# Patient Record
Sex: Female | Born: 1950 | Race: White | Hispanic: No | Smoking: Former smoker
Health system: Southern US, Community
[De-identification: ages and names within clinical notes are randomized; demographics above are authoritative.]

## PROBLEM LIST (undated history)

## (undated) DIAGNOSIS — M549 Dorsalgia, unspecified: Secondary | ICD-10-CM

## (undated) DIAGNOSIS — I10 Essential (primary) hypertension: Secondary | ICD-10-CM

## (undated) DIAGNOSIS — Z9889 Other specified postprocedural states: Secondary | ICD-10-CM

## (undated) DIAGNOSIS — R0683 Snoring: Secondary | ICD-10-CM

## (undated) DIAGNOSIS — E119 Type 2 diabetes mellitus without complications: Secondary | ICD-10-CM

## (undated) DIAGNOSIS — L03039 Cellulitis of unspecified toe: Secondary | ICD-10-CM

## (undated) DIAGNOSIS — K5900 Constipation, unspecified: Secondary | ICD-10-CM

## (undated) DIAGNOSIS — Z9289 Personal history of other medical treatment: Secondary | ICD-10-CM

## (undated) DIAGNOSIS — I639 Cerebral infarction, unspecified: Secondary | ICD-10-CM

## (undated) DIAGNOSIS — K589 Irritable bowel syndrome without diarrhea: Secondary | ICD-10-CM

## (undated) DIAGNOSIS — I679 Cerebrovascular disease, unspecified: Secondary | ICD-10-CM

## (undated) DIAGNOSIS — E785 Hyperlipidemia, unspecified: Secondary | ICD-10-CM

## (undated) DIAGNOSIS — M797 Fibromyalgia: Secondary | ICD-10-CM

## (undated) DIAGNOSIS — M545 Low back pain, unspecified: Secondary | ICD-10-CM

## (undated) DIAGNOSIS — J45909 Unspecified asthma, uncomplicated: Secondary | ICD-10-CM

## (undated) DIAGNOSIS — E669 Obesity, unspecified: Secondary | ICD-10-CM

## (undated) HISTORY — DX: Low back pain, unspecified: M54.50

## (undated) HISTORY — DX: Snoring: R06.83

## (undated) HISTORY — DX: Cellulitis of unspecified toe: L03.039

## (undated) HISTORY — PX: GALLBLADDER SURGERY: SHX652

## (undated) HISTORY — DX: Hyperlipidemia, unspecified: E78.5

## (undated) HISTORY — PX: HERNIA REPAIR: SHX51

## (undated) HISTORY — DX: Fibromyalgia: M79.7

## (undated) HISTORY — DX: Low back pain: M54.5

## (undated) HISTORY — DX: Personal history of other medical treatment: Z92.89

## (undated) HISTORY — PX: TONSILLECTOMY AND ADENOIDECTOMY: SHX28

## (undated) HISTORY — DX: Cerebrovascular disease, unspecified: I67.9

## (undated) HISTORY — DX: Unspecified asthma, uncomplicated: J45.909

## (undated) HISTORY — PX: CHOLECYSTECTOMY: SHX55

## (undated) HISTORY — DX: Constipation, unspecified: K59.00

## (undated) HISTORY — DX: Dorsalgia, unspecified: M54.9

## (undated) HISTORY — PX: CATARACT EXTRACTION: SUR2

## (undated) HISTORY — DX: Other specified postprocedural states: Z98.890

---
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
## 2000-06-30 ENCOUNTER — Observation Stay (HOSPITAL_COMMUNITY): Admission: RE | Admit: 2000-06-30 | Discharge: 2000-07-01 | Payer: Self-pay | Admitting: General Surgery

## 2000-06-30 ENCOUNTER — Encounter: Payer: Self-pay | Admitting: General Surgery

## 2001-04-26 DIAGNOSIS — I639 Cerebral infarction, unspecified: Secondary | ICD-10-CM

## 2001-04-26 HISTORY — PX: COLONOSCOPY: SHX174

## 2001-04-26 HISTORY — DX: Cerebral infarction, unspecified: I63.9

## 2002-04-10 ENCOUNTER — Ambulatory Visit: Admission: RE | Admit: 2002-04-10 | Discharge: 2002-04-10 | Payer: Self-pay | Admitting: Internal Medicine

## 2002-06-13 ENCOUNTER — Inpatient Hospital Stay (HOSPITAL_COMMUNITY): Admission: RE | Admit: 2002-06-13 | Discharge: 2002-06-17 | Payer: Self-pay | Admitting: General Surgery

## 2008-07-29 ENCOUNTER — Inpatient Hospital Stay (HOSPITAL_COMMUNITY): Admission: AD | Admit: 2008-07-29 | Discharge: 2008-07-31 | Payer: Self-pay | Admitting: Pediatrics

## 2008-07-30 ENCOUNTER — Encounter (INDEPENDENT_AMBULATORY_CARE_PROVIDER_SITE_OTHER): Payer: Self-pay | Admitting: Pediatrics

## 2009-08-10 ENCOUNTER — Ambulatory Visit: Payer: Self-pay | Admitting: Cardiology

## 2010-08-05 LAB — CBC
HCT: 36.8 % (ref 36.0–46.0)
Hemoglobin: 12.8 g/dL (ref 12.0–15.0)
MCHC: 34.7 g/dL (ref 30.0–36.0)
MCV: 90.1 fL (ref 78.0–100.0)
Platelets: 188 10*3/uL (ref 150–400)
RBC: 4.08 MIL/uL (ref 3.87–5.11)
RDW: 14.3 % (ref 11.5–15.5)
WBC: 6.9 10*3/uL (ref 4.0–10.5)

## 2010-08-05 LAB — COMPREHENSIVE METABOLIC PANEL WITH GFR
ALT: 20 U/L (ref 0–35)
AST: 17 U/L (ref 0–37)
Albumin: 3.5 g/dL (ref 3.5–5.2)
Alkaline Phosphatase: 73 U/L (ref 39–117)
BUN: 12 mg/dL (ref 6–23)
CO2: 26 meq/L (ref 19–32)
Calcium: 8.9 mg/dL (ref 8.4–10.5)
Chloride: 102 meq/L (ref 96–112)
Creatinine, Ser: 0.64 mg/dL (ref 0.4–1.2)
GFR calc Af Amer: >60 mL/min (ref >60)
GFR calc non Af Amer: >60 mL/min (ref >60)
Glucose, Bld: 128 mg/dL — ABNORMAL HIGH (ref 70–99)
Potassium: 3.6 meq/L (ref 3.5–5.1)
Sodium: 136 meq/L (ref 135–145)
Total Bilirubin: 0.8 mg/dL (ref 0.3–1.2)
Total Protein: 6.2 g/dL (ref 6.0–8.3)

## 2010-08-05 LAB — PROTIME-INR: Prothrombin Time: 14 seconds (ref 11.6–15.2)

## 2010-08-05 LAB — LIPID PANEL
Cholesterol: 162 mg/dL (ref 0–200)
HDL: 31 mg/dL — ABNORMAL LOW (ref >39)
LDL Cholesterol: 109 mg/dL — ABNORMAL HIGH (ref 0–99)
Triglycerides: 112 mg/dL (ref <150)

## 2010-08-05 LAB — GLUCOSE, CAPILLARY: Glucose-Capillary: 118 mg/dL — ABNORMAL HIGH (ref 70–99)

## 2010-08-05 LAB — HEMOGLOBIN A1C
Hgb A1c MFr Bld: 6.5 % — ABNORMAL HIGH (ref 4.6–6.1)
Mean Plasma Glucose: 140 mg/dL

## 2010-09-08 NOTE — H&P (Signed)
Andrea Rios, Andrea Rios                  ACCOUNT NO.:  [id number]   MEDICAL RECORD NO.:  [id number]          PATIENT TYPE:  INP   LOCATION:  3113                         FACILITY:  [facility]   PHYSICIAN:  Deanna Artis. Hickling, M.D.DATE OF BIRTH:  12-09-1950   DATE OF ADMISSION:  07/29/2008  DATE OF DISCHARGE:                              HISTORY & PHYSICAL   CHIEF COMPLAINT:  Right-sided numbness and weakness.   HISTORY OF PRESENT CONDITION:  The patient was last known normal around  1330 hours.  She was having lunch.  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  She developed paresthesias of the right arm, which  did not go away and intensified over time.  Her symptoms spread to her  face between 1500 and 1510.  She wanted to go home, but coworkers  persuaded her to go to the emergency room where she checked in at 1528.  She was sent to CT scan and had it completed at 1551.  Call was placed  to me at 1615.  At that time, she was 2 hours and 45 minutes after last  being known normal.  Her NIH stroke scale was 5.  She had 1 point each  for ataxia with her upper extremity limbs, 1 point each for weakness in  her right arm and leg and 1 point for sensation.   Because of the combination of weakness, ataxia, and numbness that seemed  to be spreading, I recommended treatment with intravenous t-PA.  The  patient weighs 258 pounds and was given 90 mg of Activase 9 mg bolus at  1630 followed by 81 mg over the next every ensuing hour.  The patient  was transferred to [facility] and over time, the weakness  disappeared, the numbness subsided.   PAST MEDICAL HISTORY:  Remarkable for morbid obesity, osteoarthritis of  her knees, and symptomatic hypertension.   PAST SURGICAL HISTORY:  Carpal tunnel x2, abdominal herniorrhaphy repair  x3, tonsillectomy, and cholecystectomy.   RISK FACTORS FOR STROKE:  Morbid obesity and hypertension.   CURRENT MEDICATIONS:  Amlodipine/benazepril 5/20  one daily.   DRUG ALLERGIES:  SULFA causes hives, CORTICOSTEROIDS cause altered  mental status and swelling.   FAMILY HISTORY:  Positive for stroke in maternal grandmother who died in  her 88s of a stroke.  She had severe heart disease and presumed atrial  fibrillation.  Both parents died of congestive heart failure.   SOCIAL HISTORY:  The patient is widowed.  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  She is a Engineer, agricultural.  She is Jehovah's  Witness.  She has not used tobacco, alcohol, or drugs.   REVIEW OF SYSTEMS:  Her 12-system review is recorded on the chart and is  remarkable for shortness of breath with exertion, pain in her knees and  her neck from osteoarthritis, and she is postmenopausal.  Remainder is  negative except as noted above in history of present illness.   PHYSICAL EXAMINATION:  GENERAL:  Pleasant woman who is anxious and  at  times tearful.  VITAL SIGNS:  Temperature 98.9, blood pressure 132/56, resting pulse 70,  respirations 20, oxygen saturation 94%, height 5 feet 4 inches, weight  250 pounds.  HEAD, EYES, EARS, NOSE, AND THROAT:  No infections.  NECK:  No bruits.  Supple neck.  LUNGS:  Clear.  HEART:  No murmurs.  Pulses normal.  ABDOMEN:  Protuberant.  Bowel sounds normal.  No hepatosplenomegaly.  SKIN:  No lesions.  NEUROLOGIC:  Awake, alert without dysphasia or dyspraxia.  Cranial  nerves, round reactive pupils.  Visual fields full to double  simultaneous stimuli.  Extraocular movements full and conjugate.  Symmetric facial strength.  Midline tongue and uvula.  The patient had  decreased sensation to pinprick on the right side that did not respect  to midline.  Motor examination, the patient showed normal strength  except her hip flexors, which were 4/5 bilaterally.  She had pain in her  knees, which limited some of the testing that I could do with her legs,  but basically she had normal strength everywhere else.  She had good   fine motor movements and good finger apposition.  No pronator drift.  Sensation showed hypesthesia on the right side that did not respect the  midline, this involves arm, face, trunk, and legs.  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  She extinguished to  double simultaneous stimuli for tactile sensation, she did not for  visual.  She had no signs of dysphasia.  Reflexes were absent.  She had  bilateral flexor plantar responses.  I watched her get up to walk and  she has got a somewhat antalgic gait, but does not show hemiparetic  gait.   IMPRESSION:  1. Left brain subcortical non-hemorrhagic infarction.  I suspect      posterior limb of the internal capsule based on her numbness.  2. Hypertension, in fair control.  3. Morbid obesity.  The patient states that she lost 35 pounds in the      past year and gained back 10 of it.   PLAN:  The patient will be admitted to the hospital for observation and  treatment.  There is very little that we can do if she progresses  overnight.  We will perform an MRI scan of the brain in the morning.  We  will also obtain noninvasive carotid Dopplers, 2-D echocardiogram, and  transcranial Doppler, and obtain hemoglobin A1c and fasting lipid panel.  The latter will have to be obtained on November 30, 2008, because of the  use of t-PA.  She will receive aspirin at 1630 hours on July 30, 2008.  She received aspirin 15 minutes before she received t-PA, I think  possibly before they realized that t-PA might be given.  The NIH stroke  scale that I obtained was 4.  She had drift of her legs, both legs  slightly had hemihypesthesia on the right side and also extinguish to  double simultaneous stimuli on the right.  The rest was normal.   Her modified Rankin was 0 prior to admission.      Deanna Artis. Sharene Skeans, M.D.  Electronically Signed     [facility]/MEDQ  D:   07/29/2008  T:  07/30/2008  Job:  [id number]   cc:   Kirstie Peri, MD

## 2010-09-08 NOTE — Discharge Summary (Signed)
Andrea Rios, Andrea Rios                  ACCOUNT NO.:  [id number]   MEDICAL RECORD NO.:  [id number]          PATIENT TYPE:  INP   LOCATION:  3113                         FACILITY:  [facility]   PHYSICIAN:  Melvyn Novas, M.D.  DATE OF BIRTH:  Jan 15, 1951   DATE OF ADMISSION:  07/29/2008  DATE OF DISCHARGE:  07/31/2008                               DISCHARGE SUMMARY   DIAGNOSES AT TIME OF DISCHARGE:  1. Left brain transient ischemic attack, symptoms resolved.  2. Obesity.  3. Hypertension.  4. Question history of obstructive sleep apnea.  5. Nocturia.  6. Insomnia.  7. Right hand weakness, likely carpal tunnel.  8. Mild dyslipidemia.   Medicines at the time of discharge.  1. Zocor 20 mg a day.  2. Aspirin 81 mg a day.  3. Amlodipine/benazepril 5/20 mg a day.   STUDIES PERFORMED:  1. CT of brain completed at [facility] showed no acute abnormalities.  2. Now, MRI of the brain at [facility] shows no acute abnormality.  3. MRA of the head is a negative.  4. Chest x-ray shows no active lung disease.  5. A 2-D echocardiogram shows EF of 50-60% with no obvious source of      embolus.  6. Carotid Doppler shows no ICA stenosis.  7. EKG not present in chart, though telemetry shows sinus rhythm.   LABORATORY STUDIES:  Hemoglobin A1c 6.5.  Cholesterol was 62,  triglycerides 112, HDL 31, LDL 109.  Chemistry with glucose 128,  otherwise normal.  Coagulation studies normal.  CBC normal.   HISTORY OF PRESENT ILLNESS:  Andrea Rios is a 60 year old right-handed  white female who was last known normal at 1:30 p.m. the day of  admission.  She was having lunch.  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  At 1:30, she developed paresthesias of the right  arm which did not go away and intensified overtime.  Her symptoms spread  to her face around 3 o'clock.  She wanted to go home, but coworkers  persuaded her to go to the emergency room.  CT of the head done at  [facility] was normal.  Her NIH  stroke scale was 5.  [facility] ED  physician, Dr. Sharene Skeans, neurologist on-call, who ordered intravenous IV  t-PA and transferred to [facility].  She was transferred to [facility]  and admitted to the neuro ICU post full-dose IV t-PA.   HOSPITAL COURSE:  MRI of the brain was unrevealing for acute stroke.  Symptoms were definitely that of stroke and some very mild right hand  weakness remains at time of discharge.  She was evaluated by PT and OT  and felt to have no therapy needs.  She has vascular risk factors of  hypertension, obesity, dyslipidemia, and slightly elevated hemoglobin  A1c.  The patient was placed on Zocor to lower her LDL as well as  aspirin for secondary stroke prevention.  She has been advised to return  to work on Monday and follow up with her primary care physician for  ongoing risk factor control including weight  loss.  The patient also needs evaluation of insomnia.  She apparently has had a  speech study in the past.  She has asked to follow up with Dr. Vickey Huger.   CONDITION ON DISCHARGE:  The patient with mild right grip weakness,  otherwise no abnormal neurologic findings.  Heart rate regular.  Breath  sounds are clear.   DISCHARGE PLAN:  1. Discharge home with family.  2. Follow up primary care physician within 1 month for ongoing risk      factor control.  3. Follow up with DR. Sethis /    Dr. Porfirio Mylar Dohmeier, consider sleep evaluation Verne Spurr study followup.  1. May return to work on Monday.  2. New statin for dyslipidemia.      Annie Main, N.P.      Melvyn Novas, M.D.  Electronically Signed    SB/MEDQ  D:  07/31/2008  T:  08/01/2008  Job:  [id number]   cc:   Kirstie Peri, MD

## 2010-09-11 NOTE — Discharge Summary (Signed)
   NAMELANNETTE, Andrea Rios                            ACCOUNT NO.:  [id number]   MEDICAL RECORD NO.:  [id number]                   PATIENT TYPE:  INP   LOCATION:  0484                                 [facility]ACILITY:  [facility]   PHYSICIAN:  Adolph Pollack, M.D.            DATE OF BIRTH:  11-26-1950   DATE OF ADMISSION:  06/12/2002  DATE OF DISCHARGE:  06/17/2002                                 DISCHARGE SUMMARY   PRINCIPAL DISCHARGE DIAGNOSIS:  Recurrent incarcerated ventral hernia.   SECONDARY DIAGNOSES:  1. Gastroesophageal reflux.  2. Hypertension.  3. Sleep apnea.  4. Ileus.   PROCEDURE:  Laparoscopic repair of incarcerated ventral incisional hernia  with Gore-Tex dual mesh on 06/12/02.   HISTORY OF PRESENT ILLNESS:  The patient is a 60 year old female who has had  a known recurrent ventral incisional hernia.  She has had two previous  repairs with mesh and has recurrence of her symptoms symptomatic.  She is  admitted for elective repair.   HOSPITAL COURSE:  She had undergone the above procedure.  She was found to  have a giant recurrent hernia, and it took a very large piece of Gore-Tex  dual mesh.  Postoperatively, pain control was an issue as well as nausea and  mild ileus.  However, over the ensuing days this improved, then tolerating a  diet, passing flatus, and had a small bowel movement, and was felt to be  ready for discharge.   DISPOSITION:  Discharged to home on 06/17/02, in satisfactory condition.   DISCHARGE MEDICATIONS:  1. She is told to continue home medications.  2. Tylox for pain.  3. She is told to take some Mylicon for gas as needed.   ACTIVITY:  She is given strict activity restrictions.   FOLLOWUP:  She will follow up in the office in about two to three weeks.                                                Adolph Pollack, M.D.    Andrea Rios  D:  06/25/2002  T:  06/25/2002  Job:  [id number]

## 2010-09-11 NOTE — H&P (Signed)
Andrea Rios, Andrea Rios                            ACCOUNT NO.:  [id number]   MEDICAL RECORD NO.:  [id number]                   PATIENT TYPE:  AMB   LOCATION:  DAY                                  FACILITY:  [facility]   PHYSICIAN:  Adolph Pollack, M.D.            DATE OF BIRTH:  08-04-1950   DATE OF ADMISSION:  06/12/2002  DATE OF DISCHARGE:                                HISTORY & PHYSICAL   REASON FOR ADMISSION:  Elective ventral hernia repair.   HISTORY OF PRESENT ILLNESS:  The patient is a 60 year old female who has had  two abdominal wall hernia repairs.  Unfortunately, she has had a recurrence.  Both of these defects have been fixed by open approach.  Now she is having  pain from this, but no obstructive type symptoms.  She is now admitted for  elective laparoscopic ventral hernia repair with mesh.   PAST MEDICAL HISTORY:  1. Abdominal hernias.  2. Hypertension.  3. Gastroesophageal reflux disease.  4. Arthritis.  5. Rosacea of cheeks.  6. Mild obstructive sleep apnea.  7. Non-cardiac chest pain.  8. Sinus related headaches.   PAST SURGICAL HISTORY:  1. Abdominal hernia repair x2.  2. Laparoscopic cholecystectomy with intraoperative cholangiogram.  3. Tonsillectomy and adenoidectomy.   ALLERGIES:  1. SULFA MEDICATIONS.  2. STEROIDS.   MEDICATIONS:  1. Ibuprofen p.r.n.  2. Aciphex.  3. Tums p.r.n.   SOCIAL HISTORY:  She is widowed.  She is a Scientist, product/process development.  No tobacco  use.  She occasionally has an alcoholic beverage.   FAMILY HISTORY:  Father died from congestive heart failure.  Mother died  also has problems with congestive heart failure.  She has a brother with  coronary artery disease and a sister with heart disease and hypertension.   REVIEW OF SYMPTOMS:  Please see attached health history/assessment.   PHYSICAL EXAMINATION:  GENERAL:  An obese female in no acute distress.  VITAL SIGNS:  She weighs 262 pounds.  Temperature is 97.6, blood pressure is  150/80, pulse is 72.  SKIN:  Warm and dry, no jaundice.  HEENT:  Extraocular movements intact.  CARDIOVASCULAR:  Heart demonstrates regular rate and rhythm, no murmur  heard.  RESPIRATORY:  Breath sounds equal and clear.  Respirations unlabored.  ABDOMEN:  Soft.  There is a bulge to the right of the midline incision in  the periumbilical region that is tender, but is reducible.  No other masses  noted.  No organomegaly noted.  EXTREMITIES:  Trace pretibial edema.   IMPRESSION:  Recurrent ventral incisional hernia.   PLAN:  Laparoscopic repair with mesh.  The procedure and the risks were  discussed with her preoperatively.  She understands and agrees to proceed.  Adolph Pollack, M.D.    Andrea Rios  D:  06/12/2002  T:  06/12/2002  Job:  [id number]

## 2010-09-11 NOTE — Op Note (Signed)
[facility]  Patient:    Andrea Rios, Andrea Rios                         MRN: [id number] Proc. Date: 06/30/00 Adm. Date:  [id number] Attending:  Arlis Porta CC:         Dr. Franky Macho             Dr. Letta Median                           Operative Report  PREOPERATIVE DIAGNOSES:  Biliary dyskinesia and ventral hernia.  POSTOPERATIVE DIAGNOSES:  Biliary dyskinesia and ventral hernia.  PROCEDURE:  Laparoscopic cholecystectomy.  SURGEON:  Dr. Abbey Chatters.  FIRST ASSISTANT:  Dr. Chevis Pretty.  SECOND ASSISTANT:  Dr. Peter Minium.  ANESTHESIA:  General.  INDICATIONS FOR PROCEDURE:  This is a 60 year old female whose had two abdominal wall hernia repairs in the past. She has had fairly classic biliary colic type pain. An ultrasound was normal. She did undergo a nuclear medicine hepatobiliary scan which demonstrated gallbladder ejection fraction only 17%. Although she has an obvious recurrent hernia which is noted on CT scan, her symptoms appear to be more of a biliary type. She saw me wondering if a laparoscopic cholecystectomy and laparoscopic ventral hernia repair could be done simultaneously. I told her I would not do that because of increased risk of infection. I told her she could either go back to Dr. Lovell Sheehan and have her laparoscopic cholecystectomy done and then come back to me for the hernia repair or I would be glad to do the laparoscopic cholecystectomy. She decided to have it done here in [place]. The procedure and risks have been explained to her.  TECHNIQUE:  She is placed supine on the operating table and a general anesthetic was administered. Her abdomen was sterilely prepped and draped. A previous subumbilical incision was reincised sharply. The fascia was identified and incised. The peritoneal cavity was then entered bluntly. I then placed a pursestring suture of #0 Vicryl on the fascial edges. The Hasson trocar was introduced to  the peritoneal cavity and pneumoperitoneum created by insufflation of CO2 gas. I then introduced the laparoscope into the peritoneal cavity. I was able to see the right lower quadrant and then when I was looking up to the right upper quadrant, there were adhesions between the omentum and the anterior abdominal wall. I could see the hernia and there was no incarcerated small bowel in it. I also saw the previously placed mesh from the hernia repair.  Next under direct vision, a 5 mm trocar was placed in the right lateral mid abdomen. Using sharp dissection only, adhesions were divided and the right upper quadrant area exposed. Subsequently an 11 mm trocar was placed through and epigastric incision and another 5 mm trocar was placed in the right upper quadrant. We were able to grasp the fundus of the gallbladder and also grasp some infundibulum. Using careful blunt dissection, I identified the gallbladder cystic duct junction and skeletonized the cystic duct. I mobilized the infundibulum. I also identified the anterior branch of the cystic artery and skeletonized this. The cystic duct was small. I clipped it three times proximally, once distally and divided it. I decided preoperatively not to do a cholangiogram because her liver function tests were normal.  Next the anterior branch of the cystic artery was clipped and divided and the posterior branch of the  cystic artery was clipped and divided. The gallbladder was dissected free and removed from the liver bed using the cautery. Two holes were placed in the gallbladder through which bile and some small  sludge type material was drained. Once I had removed the gallbladder from the liver bed, I placed it in an endopouch bag. I then copiously irrigated the liver bed, bleeding points were controlled with the cautery. I then irrigated the perihepatic area until the fluid was clear. I once again inspected the gallbladder fossa and noted no bile  leak or no bleeding. I then removed the gallbladder from the abdominal cavity. I identified the subumbilical fascial defect which was created and closed under laparoscopic guidance by tightening down and tying the pursestring suture. Next the patient was placed in the flat position and the rest of the irrigation fluid was evacuated (as much as possible). The two 5 mm trocars were then removed.  Skin incisions were then closed with 4-0 monocryl subcuticular stitches except for an inferior 5 mm incision which was bleeding some from the skin edges and this required simple exterior 4-0 monocryl sutures. Steri-Strips and sterile dressings were used. She tolerated the procedure well without any apparent complications and then she was taken to the recovery room in satisfactory condition. DD:  06/30/00 TD:  07/01/00 Job: [id number]

## 2010-09-11 NOTE — Op Note (Signed)
NAMEMOYA, DUAN                            ACCOUNT NO.:  [id number]   MEDICAL RECORD NO.:  [id number]                   PATIENT TYPE:  AMB   LOCATION:  DAY                                  FACILITY:  [facility]   PHYSICIAN:  Adolph Pollack, M.D.            DATE OF BIRTH:  1951/03/03   DATE OF PROCEDURE:  06/12/2002  DATE OF DISCHARGE:                                 OPERATIVE REPORT   PREOPERATIVE DIAGNOSIS:  Recurrent ventral hernia.   POSTOPERATIVE DIAGNOSIS:  Recurrent incarcerated ventral hernia.   PROCEDURE:  Laparoscopic repair of incarcerated ventral hernia with Gore-Tex  dual mesh.   SURGEON:  Adolph Pollack, M.D.   ASSISTANT:  Gita Kudo, M.D.   ANESTHESIA:  General.   INDICATIONS FOR PROCEDURE:  The patient is a 60 year old female who has had  two abdominal wall hernia repairs before.  She is having more pain from this  recurrent hernia.  Both the other approaches have been from an anterior  approach.  She now presents for elective hernia repair.  The procedure and  the risks were discussed with her.   TECHNIQUE:  She was seen in the holding area, brought to the operating room  and placed supine on the operating table and general anesthetic was  administered.  The abdomen was widely sterilely prepped and draped and Foley  catheter had been placed in the bladder.  In the left lateral abdominal wall  an incision was made and carried down through the subcutaneous tissue.  Different layers of fascia were incised and the peritoneum was incised  sharply and under direct vision the peritoneal cavity entered.  Two stay  sutures of 0 Vicryl were placed on the superior and inferior aspect of the  fascia and a Hasson trocar was introduced into the peritoneal cavity.  A  pneumoperitoneum was created upon insufflation with CO2 gas.   Next, the laparoscope was introduced.  The hernia was identified.  There  were actually areas of hernia with exposed mesh.  There  appeared to be small  bowel and colon densely adherent to the mesh.  I placed a 5-mm trocar in the  left upper quadrant.  Again, by using gentle sharp and blunt dissection with  various electrocautery away from the bowel to reduce omental contents from  the anterior abdominal wall and the hernia.  I noticed that there were three  separate defects along the upper midline with the inferior defect and the  superior defect containing bowel.  The inferior defect contained small bowel  and the superior defect contained large bowel.  I did as much dissection as  I could around the bowel.  I then was able to develop a plane between the  colon and the mesh at the superior defect.  Using sharp dissection I was  able to reduce the colon back into the peritoneal cavity without injuring  the colon.  I subsequently went to the middle defect and removed reduced  omentum from that.  I then concentrated on the largest defect which was  inferior with respect to the upper midline incision and previous repair.  I  was able to gently retract the small bowel toward the midline using sharp  dissection and divide firm peritoneal attachments to the small bowel without  injuring the small bowel.  I examined both the colon and the small bowel  multiple times after this and noted no injury.  I then completely reduced  omentum from the anterior abdominal wall exposing the defect which was  basically the entire epigastric incision with bridges in between the three  aforementioned defects.   I placed spinal needles around the periphery of the defect and kneaded the  piece of mesh that was approximately 26 cm x 34 cm.  __________  a piece of  Gore-Tex corduroy dual mesh with antimicrobial coating was brought onto the  field.  I then placed stay sutures in eight different areas of the mesh.  First the four quadrants, then I bisected each of the four quadrants placing  a stay suture there.  These were of 0 Novofil.  I then  rolled up the mesh  and passed it through the left mid abdominal incision.  I added another 5-mm  trocar in the right upper quadrant and put a 10-mm trocar in the right mid  abdomen.  I subsequently added another 5-mm trocar in the right lower  quadrant.   Once the mesh was in the abdominal cavity, I unrolled it and made sure that  the rough side was pointing up and the smooth side down.  I then made eight  small incisions around the periphery corresponding to where the stay sutures  were and brought each of the stay sutures up across the fascial bridge and  then tied them down partially anchoring the mesh to the anterior abdominal  wall with the rough side pointing up and smooth side pointing down.  I then  used a spiral tacker to anchor the mesh further with an outer row of tacks  and then an inner row of tacks.  This provided for more than adequate  coverage with adequate overlapping of the hernia defect.  The intestine was  once again examined and no injuries were noted.  No bleeding was noted.  I  subsequently began removing trocars from the left side of the abdomen.  I  repaired the fascial defect of the left mid abdominal incision and then  laparoscopically checked to make sure it was repaired without any evidence  of abdominal contents going up into the incision and none was seen.  The  fascia was repaired in this site with 0 Vicryl sutures.  I then removed the  remaining trocars and released the pneumoperitoneum.   All skin incisions were then closed with staples and sterile dressings were  applied.   She tolerated the procedure well without any apparent complications.  The  lysis of adhesions took approximately 1 1/2-2 hours which is twice the  normal time but this allowed for [place] to do the laparoscopic repair.  She was  taken to the recovery room in satisfactory condition.                                              Jim Desanctis.  Abbey Chatters, M.D.    Kari Baars  D:  06/12/2002   T:  06/12/2002  Job:  [id number]

## 2012-10-08 DIAGNOSIS — R079 Chest pain, unspecified: Secondary | ICD-10-CM

## 2012-10-09 ENCOUNTER — Observation Stay (HOSPITAL_COMMUNITY)
Admission: AD | Admit: 2012-10-09 | Discharge: 2012-10-09 | Disposition: A | Discharge status: Home / Self Care | Payer: BC Managed Care – PPO | Source: Other Acute Inpatient Hospital | Attending: Cardiology | Admitting: Cardiology

## 2012-10-09 ENCOUNTER — Encounter (HOSPITAL_COMMUNITY): Payer: Self-pay | Admitting: Physician Assistant

## 2012-10-09 ENCOUNTER — Encounter (HOSPITAL_COMMUNITY)
Admission: AD | Disposition: A | Discharge status: Home / Self Care | Payer: Self-pay | Source: Other Acute Inpatient Hospital | Attending: Cardiology

## 2012-10-09 ENCOUNTER — Other Ambulatory Visit: Payer: Self-pay | Admitting: Physician Assistant

## 2012-10-09 DIAGNOSIS — E669 Obesity, unspecified: Secondary | ICD-10-CM | POA: Insufficient documentation

## 2012-10-09 DIAGNOSIS — I200 Unstable angina: Secondary | ICD-10-CM

## 2012-10-09 DIAGNOSIS — Z79899 Other long term (current) drug therapy: Secondary | ICD-10-CM | POA: Insufficient documentation

## 2012-10-09 DIAGNOSIS — I10 Essential (primary) hypertension: Secondary | ICD-10-CM | POA: Insufficient documentation

## 2012-10-09 DIAGNOSIS — E119 Type 2 diabetes mellitus without complications: Secondary | ICD-10-CM | POA: Insufficient documentation

## 2012-10-09 DIAGNOSIS — R072 Precordial pain: Principal | ICD-10-CM | POA: Diagnosis present

## 2012-10-09 DIAGNOSIS — Z8249 Family history of ischemic heart disease and other diseases of the circulatory system: Secondary | ICD-10-CM | POA: Insufficient documentation

## 2012-10-09 DIAGNOSIS — R079 Chest pain, unspecified: Secondary | ICD-10-CM

## 2012-10-09 HISTORY — PX: LEFT HEART CATHETERIZATION WITH CORONARY ANGIOGRAM: SHX5451

## 2012-10-09 HISTORY — DX: Type 2 diabetes mellitus without complications: E11.9

## 2012-10-09 HISTORY — DX: Obesity, unspecified: E66.9

## 2012-10-09 HISTORY — DX: Essential (primary) hypertension: I10

## 2012-10-09 SURGERY — LEFT HEART CATHETERIZATION WITH CORONARY ANGIOGRAM
Anesthesia: LOCAL

## 2012-10-09 MED ORDER — ASPIRIN EC 81 MG PO TBEC: 81.0000 mg | DELAYED_RELEASE_TABLET | Freq: Every day | ORAL | Status: DC

## 2012-10-09 MED ORDER — ATORVASTATIN CALCIUM 80 MG PO TABS
80.0000 mg | ORAL_TABLET | Freq: Every day | ORAL | Status: DC
  Filled 2012-10-09: qty 1

## 2012-10-09 MED ORDER — ONDANSETRON HCL 4 MG/2ML IJ SOLN: 4.0000 mg | Freq: Four times a day (QID) | INTRAMUSCULAR | Status: DC | PRN

## 2012-10-09 MED ORDER — HEPARIN SODIUM (PORCINE) 1000 UNIT/ML IJ SOLN
INTRAMUSCULAR | Status: AC
  Filled 2012-10-09: qty 1

## 2012-10-09 MED ORDER — SODIUM CHLORIDE 0.9 % IJ SOLN: 3.0000 mL | Freq: Two times a day (BID) | INTRAMUSCULAR | Status: DC

## 2012-10-09 MED ORDER — ASPIRIN 81 MG PO CHEW: 324.0000 mg | CHEWABLE_TABLET | ORAL | Status: DC

## 2012-10-09 MED ORDER — NITROGLYCERIN 2 % TD OINT
1.0000 [in_us] | TOPICAL_OINTMENT | Freq: Four times a day (QID) | TRANSDERMAL | Status: DC
  Administered 2012-10-09: 1 [in_us] via TOPICAL
  Filled 2012-10-09: qty 30

## 2012-10-09 MED ORDER — ACETAMINOPHEN 325 MG PO TABS: 650.0000 mg | ORAL_TABLET | ORAL | Status: DC | PRN

## 2012-10-09 MED ORDER — FENTANYL CITRATE 0.05 MG/ML IJ SOLN
INTRAMUSCULAR | Status: AC
  Filled 2012-10-09: qty 2

## 2012-10-09 MED ORDER — SODIUM CHLORIDE 0.9 % IV SOLN
INTRAVENOUS | Status: DC
  Administered 2012-10-09: 14:00:00 via INTRAVENOUS

## 2012-10-09 MED ORDER — INSULIN ASPART 100 UNIT/ML ~~LOC~~ SOLN: 0.0000 [IU] | Freq: Every day | SUBCUTANEOUS | Status: DC

## 2012-10-09 MED ORDER — SODIUM CHLORIDE 0.9 % IJ SOLN: 3.0000 mL | INTRAMUSCULAR | Status: DC | PRN

## 2012-10-09 MED ORDER — HEPARIN (PORCINE) IN NACL 2-0.9 UNIT/ML-% IJ SOLN
INTRAMUSCULAR | Status: AC
  Filled 2012-10-09: qty 1000

## 2012-10-09 MED ORDER — VERAPAMIL HCL 2.5 MG/ML IV SOLN
INTRAVENOUS | Status: AC
  Filled 2012-10-09: qty 2

## 2012-10-09 MED ORDER — LISINOPRIL 20 MG PO TABS: 20.0000 mg | ORAL_TABLET | Freq: Every day | ORAL | Status: DC

## 2012-10-09 MED ORDER — SODIUM CHLORIDE 0.9 % IV SOLN: INTRAVENOUS | Status: AC

## 2012-10-09 MED ORDER — AMLODIPINE BESYLATE 5 MG PO TABS: 5.0000 mg | ORAL_TABLET | Freq: Every day | ORAL | Status: DC

## 2012-10-09 MED ORDER — LIDOCAINE HCL (PF) 1 % IJ SOLN
INTRAMUSCULAR | Status: AC
  Filled 2012-10-09: qty 30

## 2012-10-09 MED ORDER — INSULIN ASPART 100 UNIT/ML ~~LOC~~ SOLN: 0.0000 [IU] | Freq: Three times a day (TID) | SUBCUTANEOUS | Status: DC

## 2012-10-09 MED ORDER — MORPHINE SULFATE 2 MG/ML IJ SOLN
INTRAMUSCULAR | Status: AC
  Administered 2012-10-09: 2 mg via INTRAVENOUS
  Filled 2012-10-09: qty 1

## 2012-10-09 MED ORDER — TRAMADOL HCL 50 MG PO TABS: 50.0000 mg | ORAL_TABLET | Freq: Four times a day (QID) | ORAL | Status: DC | PRN

## 2012-10-09 MED ORDER — NITROGLYCERIN 0.4 MG SL SUBL: 0.4000 mg | SUBLINGUAL_TABLET | SUBLINGUAL | Status: DC | PRN

## 2012-10-09 MED ORDER — NITROGLYCERIN 0.2 MG/ML ON CALL CATH LAB
INTRAVENOUS | Status: AC
  Filled 2012-10-09: qty 1

## 2012-10-09 MED ORDER — METFORMIN HCL 500 MG PO TABS: 500.0000 mg | ORAL_TABLET | Freq: Two times a day (BID) | ORAL | Status: DC

## 2012-10-09 MED ORDER — SODIUM CHLORIDE 0.9 % IV SOLN: 250.0000 mL | INTRAVENOUS | Status: DC | PRN

## 2012-10-09 MED ORDER — MIDAZOLAM HCL 2 MG/2ML IJ SOLN
INTRAMUSCULAR | Status: AC
  Filled 2012-10-09: qty 2

## 2012-10-09 MED FILL — Heparin Sodium (Porcine) 100 Unt/ML in Sodium Chloride 0.45%: INTRAMUSCULAR | Qty: 250 | Status: AC

## 2012-10-09 NOTE — CV Procedure (Signed)
   Cardiac Catheterization Procedure Note  Name: Andrea Rios MRN: [id number] DOB: 21-Jul-1950  Procedure: Left Heart Cath, Selective Coronary Angiography, LV angiography  Indication: 62 yo WF with multiple cardiac risk factors presents with symptoms of severe chest pain. Ecg and cardiac enzymes are normal.   Procedural Details: The right wrist was prepped, draped, and anesthetized with 1% lidocaine. Using the modified Seldinger technique, a 5 French sheath was introduced into the right radial artery. 3 mg of verapamil was administered through the sheath, weight-based unfractionated heparin was administered intravenously. Standard Judkins catheters were used for selective coronary angiography and left ventriculography. Catheter exchanges were performed over an exchange length guidewire. There were no immediate procedural complications. A TR band was used for radial hemostasis at the completion of the procedure.  The patient was transferred to the post catheterization recovery area for further monitoring.  Procedural Findings: Hemodynamics: AO 116/63 mean 86 mm Hg LV 118/22 mm Hg  Coronary angiography: Coronary dominance: right  Left mainstem: Normal  Left anterior descending (LAD): Normal, there is a modest sized first diagonal that is normal.  Ramus intermediate: 30% ostial.  Left circumflex (LCx): Normal  Right coronary artery (RCA): dominant, Normal  Left ventriculography: Left ventricular systolic function is normal, LVEF is estimated at 55-65%, there is no significant mitral regurgitation   Final Conclusions:   1. No significant CAD 2. Normal LV function.  Recommendations: consider noncardiac causes of chest pain.  Theron Arista [facility] 10/09/2012, 3:50 PM

## 2012-10-09 NOTE — Interval H&P Note (Signed)
History and Physical Interval Note:  10/09/2012 3:22 PM  Andrea Rios  has presented today for surgery, with the diagnosis of cp  The various methods of treatment have been discussed with the patient and family. After consideration of risks, benefits and other options for treatment, the patient has consented to  Procedure(s): LEFT HEART CATHETERIZATION WITH CORONARY ANGIOGRAM (N/A) as a surgical intervention .  The patient's history has been reviewed, patient examined, no change in status, stable for surgery.  I have reviewed the patient's chart and labs.  Questions were answered to the patient's satisfaction.     Theron Arista [facility] 10/09/2012 3:22 PM

## 2012-10-09 NOTE — Discharge Summary (Signed)
CARDIOLOGY DISCHARGE SUMMARY   Patient ID: Andrea Rios MRN: [id number] DOB/AGE: 04/27/1950 62 y.o.  Admit date: 10/09/2012 Discharge date: 10/10/2012  Primary Discharge Diagnosis:   Precordial pain  Procedures: Left Heart Cath, Selective Coronary Angiography, LV angiography   Hospital Course: Sterling V Decola is a 62 y.o. female with no history of CAD. She went to [facility] for severe chest pain. It was prolonged. She has multiple cardiac risk factors including diabetes, hypertension, family history of coronary artery disease and obesity. She was evaluated there by cardiology and cardiac catheterization was felt the best option, to further define her anatomy. She was transferred to [facility] for catheterization and taken to the cath lab.  Full cardiac catheterization results are below. The films were evaluated by Dr. Swaziland. He recommended consideration of noncardiac causes of chest pain. She was transferred to the holding area awaiting discharge. Her metformin is on hold and she should continue to hold it for 48 hours.  Post-cath, she complained of a shooting pain in her right arm with numbness and tingling in her right hand and severe wrist pain. She was examined carefully. Her symptoms responded to pain control medications. When she was assessed, she still complained of a numb feeling but had sensation and movement in her fingers. Capillary refill was within normal limits. There was no ecchymosis or hematoma at the cath site. She was treated with pain control medications in the hospital and will be given a short course of tramadol to take when necessary as an outpatient. No further inpatient workup was recommended and Ms. San is considered stable for discharge, to followup as an outpatient with primary care.  Labs and a chest x-ray Done at [facility]  Cardiac Cath: 10/09/2012 Left mainstem: Normal  Left anterior descending (LAD): Normal, there is a modest sized first  diagonal that is normal.  Ramus intermediate: 30% ostial.  Left circumflex (LCx): Normal  Right coronary artery (RCA): dominant, Normal  Left ventriculography: Left ventricular systolic function is normal, LVEF is estimated at 55-65%, there is no significant mitral regurgitation  Final Conclusions:  1. No significant CAD  2. Normal LV function.  Recommendations: consider noncardiac causes of chest pain.  FOLLOW UP PLANS AND APPOINTMENTS Allergies  Allergen Reactions  . Prednisone Hives and Swelling    (Steroid meds)  . Sulfa Antibiotics Hives     Medication List    TAKE these medications       albuterol 108 (90 BASE) MCG/ACT inhaler  Commonly known as:  PROVENTIL HFA;VENTOLIN HFA  Inhale 2 puffs into the lungs every 6 (six) hours as needed for wheezing.     albuterol (2.5 MG/3ML) 0.083% nebulizer solution  Commonly known as:  PROVENTIL  Take 2.5 mg by nebulization every 6 (six) hours as needed for wheezing.     amLODipine 5 MG tablet  Commonly known as:  NORVASC  Take 5 mg by mouth daily.     aspirin 81 MG chewable tablet  Chew 81 mg by mouth daily.     glimepiride 2 MG tablet  Commonly known as:  AMARYL  Take 2 mg by mouth daily before breakfast.     lisinopril 20 MG tablet  Commonly known as:  PRINIVIL,ZESTRIL  Take 20 mg by mouth daily.     metFORMIN 500 MG tablet  Commonly known as:  GLUCOPHAGE  Take 1 tablet (500 mg total) by mouth 2 (two) times daily with a meal. HOLD for 48 hours, restart on 10/12/2012.  MULTIVITAMIN PO  Take 1 tablet by mouth daily.     traMADol 50 MG tablet  Commonly known as:  ULTRAM  Take 1 tablet (50 mg total) by mouth every 6 (six) hours as needed for pain.        Discharge Orders   Future Orders Complete By Expires     Diet - low sodium heart healthy  As directed     Diet Carb Modified  As directed     Increase activity slowly  As directed       Follow-up Information   Follow up with [facility]. (As  needed)    Contact information:   [address] [place] [address]       Schedule an appointment as soon as possible for a visit with [facility], MD.   Contact information:   [address]  [place] [address] [phone]       BRING ALL MEDICATIONS WITH YOU TO FOLLOW UP APPOINTMENTS  Time spent with patient to include physician time: 43 min Signed: Theodore Demark, PA-C 10/10/2012, 9:13 AM Co-Sign MD

## 2012-10-09 NOTE — Progress Notes (Signed)
PA RHONDA BARRETT NOTIFIED OF CLIENT C/O RIGHT WRIST PAIN AND CLIENT STATES FINGERS NUMB ON RIGHT HAND; RIGHT RADIAL PULSE 3+; COLOR RIGHT HAND PINK AND CAPILLARY REFILL <3SEC; CLIENT CRYING WITH PAIN; ORDER NOTED AND PAIN MED GIVEN

## 2012-10-09 NOTE — Progress Notes (Addendum)
Called by RN for right wrist pain.  Pt concerned because she had shooting pain and numbness in right forearm and wrist after cath.  On exam, pt has no swelling, ecchymosis at cath site, TR band still in place. Cap refill < 3 sec, pt now has movement right hand. Still with a numb feeling but has sensation and movement.   Reassured pt that she may have a little irritation of the nerve, but she is OK and it will improve rapidly. Will give her a few Tramadol PRN. Recommended she f/u with primary care.  Theodore Demark, PA-C 10/09/2012 6:43-7:00 pm PM Beeper [phone]

## 2012-10-09 NOTE — Progress Notes (Signed)
C/O FINGERS ON RIGHT HAND BEING NUMB AND C/O 8/10 RIGHT WRIST PAIN. PAGED PA

## 2012-10-09 NOTE — H&P (Signed)
  Patient transferred from [facility] for cardiac catheterization. Complete H&P performed at outside hospital and records will be scanned into [facility]. Briefly, 62 yo WF presented with severe chest pain. Multiple cardiac risk factors including HTN, DM, prior CVA, and family history of CAD. Labs reviewed and cardiac enzymes are negative for MI. Ecg without acute changes.  Lori-Ann Lindfors [place] MD, [facility]  10/09/2012 3:22 PM

## 2012-10-10 LAB — GLUCOSE, CAPILLARY: Glucose-Capillary: 92 mg/dL (ref 70–99)

## 2012-10-10 NOTE — Discharge Summary (Signed)
Patient seen and examined and history reviewed. Agree with above findings and plan. See earlier cath note.  Andrea Rios 10/10/2012 11:34 AM

## 2012-12-04 ENCOUNTER — Other Ambulatory Visit: Payer: Self-pay | Admitting: *Deleted

## 2012-12-04 MED ORDER — METFORMIN HCL 500 MG PO TABS: 500.0000 mg | ORAL_TABLET | Freq: Two times a day (BID) | ORAL | Status: DC

## 2013-04-26 HISTORY — PX: COLONOSCOPY: SHX174

## 2013-06-08 ENCOUNTER — Other Ambulatory Visit: Payer: Self-pay | Admitting: Physician Assistant

## 2013-06-14 ENCOUNTER — Other Ambulatory Visit: Payer: Self-pay | Admitting: Physician Assistant

## 2013-06-19 ENCOUNTER — Other Ambulatory Visit: Payer: Self-pay | Admitting: Physician Assistant

## 2014-04-04 ENCOUNTER — Encounter (HOSPITAL_COMMUNITY): Payer: Self-pay | Admitting: Cardiology

## 2014-04-12 DIAGNOSIS — Z9889 Other specified postprocedural states: Secondary | ICD-10-CM

## 2014-04-12 HISTORY — DX: Other specified postprocedural states: Z98.890

## 2014-11-25 DIAGNOSIS — Z9289 Personal history of other medical treatment: Secondary | ICD-10-CM

## 2014-11-25 HISTORY — DX: Personal history of other medical treatment: Z92.89

## 2015-06-30 DIAGNOSIS — E1140 Type 2 diabetes mellitus with diabetic neuropathy, unspecified: Secondary | ICD-10-CM | POA: Diagnosis not present

## 2015-06-30 DIAGNOSIS — I10 Essential (primary) hypertension: Secondary | ICD-10-CM | POA: Diagnosis not present

## 2015-06-30 DIAGNOSIS — M79605 Pain in left leg: Secondary | ICD-10-CM | POA: Diagnosis not present

## 2015-06-30 DIAGNOSIS — Z882 Allergy status to sulfonamides status: Secondary | ICD-10-CM | POA: Diagnosis not present

## 2015-06-30 DIAGNOSIS — Z888 Allergy status to other drugs, medicaments and biological substances status: Secondary | ICD-10-CM | POA: Diagnosis not present

## 2015-06-30 DIAGNOSIS — W0110XA Fall on same level from slipping, tripping and stumbling with subsequent striking against unspecified object, initial encounter: Secondary | ICD-10-CM | POA: Diagnosis not present

## 2015-06-30 DIAGNOSIS — Z7982 Long term (current) use of aspirin: Secondary | ICD-10-CM | POA: Diagnosis not present

## 2015-06-30 DIAGNOSIS — Z79899 Other long term (current) drug therapy: Secondary | ICD-10-CM | POA: Diagnosis not present

## 2015-06-30 DIAGNOSIS — S8002XA Contusion of left knee, initial encounter: Secondary | ICD-10-CM | POA: Diagnosis not present

## 2015-06-30 DIAGNOSIS — Z8673 Personal history of transient ischemic attack (TIA), and cerebral infarction without residual deficits: Secondary | ICD-10-CM | POA: Diagnosis not present

## 2015-06-30 DIAGNOSIS — Z87891 Personal history of nicotine dependence: Secondary | ICD-10-CM | POA: Diagnosis not present

## 2015-06-30 DIAGNOSIS — S8012XA Contusion of left lower leg, initial encounter: Secondary | ICD-10-CM | POA: Diagnosis not present

## 2015-07-03 DIAGNOSIS — M79605 Pain in left leg: Secondary | ICD-10-CM | POA: Diagnosis not present

## 2015-07-03 DIAGNOSIS — M7989 Other specified soft tissue disorders: Secondary | ICD-10-CM | POA: Diagnosis not present

## 2015-11-11 DIAGNOSIS — Z87891 Personal history of nicotine dependence: Secondary | ICD-10-CM | POA: Diagnosis not present

## 2015-11-11 DIAGNOSIS — I10 Essential (primary) hypertension: Secondary | ICD-10-CM | POA: Diagnosis not present

## 2015-11-11 DIAGNOSIS — E1165 Type 2 diabetes mellitus with hyperglycemia: Secondary | ICD-10-CM | POA: Diagnosis not present

## 2015-11-11 DIAGNOSIS — Z6841 Body Mass Index (BMI) 40.0 and over, adult: Secondary | ICD-10-CM | POA: Diagnosis not present

## 2015-11-11 DIAGNOSIS — E7800 Pure hypercholesterolemia, unspecified: Secondary | ICD-10-CM | POA: Diagnosis not present

## 2015-11-28 DIAGNOSIS — I10 Essential (primary) hypertension: Secondary | ICD-10-CM | POA: Diagnosis not present

## 2015-11-28 DIAGNOSIS — E1165 Type 2 diabetes mellitus with hyperglycemia: Secondary | ICD-10-CM | POA: Diagnosis not present

## 2015-12-22 DIAGNOSIS — E1140 Type 2 diabetes mellitus with diabetic neuropathy, unspecified: Secondary | ICD-10-CM | POA: Diagnosis not present

## 2015-12-22 DIAGNOSIS — Z79899 Other long term (current) drug therapy: Secondary | ICD-10-CM | POA: Diagnosis not present

## 2015-12-22 DIAGNOSIS — Z7982 Long term (current) use of aspirin: Secondary | ICD-10-CM | POA: Diagnosis not present

## 2015-12-22 DIAGNOSIS — Z7984 Long term (current) use of oral hypoglycemic drugs: Secondary | ICD-10-CM | POA: Diagnosis not present

## 2015-12-22 DIAGNOSIS — M797 Fibromyalgia: Secondary | ICD-10-CM | POA: Diagnosis not present

## 2015-12-22 DIAGNOSIS — I10 Essential (primary) hypertension: Secondary | ICD-10-CM | POA: Diagnosis not present

## 2015-12-22 DIAGNOSIS — L237 Allergic contact dermatitis due to plants, except food: Secondary | ICD-10-CM | POA: Diagnosis not present

## 2015-12-22 DIAGNOSIS — F17200 Nicotine dependence, unspecified, uncomplicated: Secondary | ICD-10-CM | POA: Diagnosis not present

## 2016-01-16 DIAGNOSIS — Z1211 Encounter for screening for malignant neoplasm of colon: Secondary | ICD-10-CM | POA: Diagnosis not present

## 2016-01-16 DIAGNOSIS — Z0000 Encounter for general adult medical examination without abnormal findings: Secondary | ICD-10-CM | POA: Diagnosis not present

## 2016-01-16 DIAGNOSIS — Z1389 Encounter for screening for other disorder: Secondary | ICD-10-CM | POA: Diagnosis not present

## 2016-01-16 DIAGNOSIS — E1165 Type 2 diabetes mellitus with hyperglycemia: Secondary | ICD-10-CM | POA: Diagnosis not present

## 2016-01-16 DIAGNOSIS — Z6841 Body Mass Index (BMI) 40.0 and over, adult: Secondary | ICD-10-CM | POA: Diagnosis not present

## 2016-01-16 DIAGNOSIS — Z299 Encounter for prophylactic measures, unspecified: Secondary | ICD-10-CM | POA: Diagnosis not present

## 2016-01-16 DIAGNOSIS — Z7189 Other specified counseling: Secondary | ICD-10-CM | POA: Diagnosis not present

## 2016-02-04 DIAGNOSIS — Z23 Encounter for immunization: Secondary | ICD-10-CM | POA: Diagnosis not present

## 2016-02-26 DIAGNOSIS — Z6841 Body Mass Index (BMI) 40.0 and over, adult: Secondary | ICD-10-CM | POA: Diagnosis not present

## 2016-02-26 DIAGNOSIS — M62838 Other muscle spasm: Secondary | ICD-10-CM | POA: Diagnosis not present

## 2016-02-26 DIAGNOSIS — Z01419 Encounter for gynecological examination (general) (routine) without abnormal findings: Secondary | ICD-10-CM | POA: Diagnosis not present

## 2016-03-11 ENCOUNTER — Encounter: Payer: Self-pay | Admitting: *Deleted

## 2016-03-12 ENCOUNTER — Encounter: Payer: Self-pay | Admitting: Cardiovascular Disease

## 2016-03-12 ENCOUNTER — Ambulatory Visit (INDEPENDENT_AMBULATORY_CARE_PROVIDER_SITE_OTHER): Payer: Medicare Other | Admitting: Cardiovascular Disease

## 2016-03-12 VITALS — BP 148/72 | HR 82 | Ht 64.0 in | Wt 275.0 lb

## 2016-03-12 DIAGNOSIS — R0609 Other forms of dyspnea: Secondary | ICD-10-CM

## 2016-03-12 DIAGNOSIS — R002 Palpitations: Secondary | ICD-10-CM | POA: Diagnosis not present

## 2016-03-12 DIAGNOSIS — I10 Essential (primary) hypertension: Secondary | ICD-10-CM

## 2016-03-12 DIAGNOSIS — R072 Precordial pain: Secondary | ICD-10-CM

## 2016-03-12 NOTE — Progress Notes (Signed)
CARDIOLOGY CONSULT NOTE  Patient ID: Andrea Rios MRN: [id number] DOB/AGE: 04-29-1950 65 y.o.  Admit date: (Not on file) Primary Physician: Monico Blitz, MD Referring Physician:   Reason for Consultation: CAD  HPI: The patient is a 65 year old woman referred for evaluation of chest pain. Coronary angiography 10/09/12 showed an ostial 30% stenosis in the ramus. At that time left ventricular systolic function was normal. Has a history of hypertension and diabetes.  Since summer, she has been experiencing a sensation of her heart racing when she sits up in bed and stands up. She has had associated dizziness. She has chronic exertional dyspnea which has not gotten any worse and fluctuates. She does have bilateral leg edema but is not on diuretics. She says she has sleep apnea and needs a sleep study.  ECG performed in the office today which I personally reviewed demonstrates normal sinus rhythm with no ischemic ST segment or T-wave abnormalities, nor any arrhythmias.   Family history: Both parents died of congestive heart failure, sister has a history of myocardial infarction and brother has a history of multiple myocardial infarctions.  Social history: Originally from the [employer] [place].   Allergies  Allergen Reactions  . Prednisone Hives and Swelling    (Steroid meds)  . Sulfa Antibiotics Hives  . Zocor [Simvastatin]     Aching     Current Outpatient Prescriptions  Medication Sig Dispense Refill  . albuterol (PROVENTIL) (2.5 MG/3ML) 0.083% nebulizer solution Take 2.5 mg by nebulization every 6 (six) hours as needed for wheezing.    Marland Kitchen amLODipine (NORVASC) 10 MG tablet Take 10 mg by mouth daily.    Marland Kitchen aspirin 81 MG chewable tablet Chew 81 mg by mouth daily.    . Benzyl Alcohol (ULESFIA) 5 % LOTN Apply topically as needed.    . budesonide-formoterol (SYMBICORT) 160-4.5 MCG/ACT inhaler Inhale 2 puffs into the lungs 2 (two) times daily.    Marland Kitchen gabapentin  (NEURONTIN) 300 MG capsule Take 300 mg by mouth at bedtime.    Marland Kitchen glimepiride (AMARYL) 2 MG tablet Take 2 mg by mouth daily before breakfast.    . lisinopril (PRINIVIL,ZESTRIL) 40 MG tablet Take 40 mg by mouth daily.    . metFORMIN (GLUCOPHAGE) 500 MG tablet Take 1 tablet (500 mg total) by mouth 2 (two) times daily with a meal. HOLD for 48 hours, restart on 10/12/2012. 60 tablet 3   No current facility-administered medications for this visit.     Past Medical History:  Diagnosis Date  . Asthma   . Back pain   . Cerebrovascular disease   . Constipation   . Diabetes ([place])   . Fibromyalgia   . H/O colonoscopy 04/12/2014   polp, diverticulosis, Dr. Britta Mccreedy  . H/O mammogram 11/2014   normal  . HTN (hypertension)   . Hyperlipidemia   . Lumbago   . Obesity   . Paronychia of toe   . Snoring     Past Surgical History:  Procedure Laterality Date  . LEFT HEART CATHETERIZATION WITH CORONARY ANGIOGRAM N/A 10/09/2012   Procedure: LEFT HEART CATHETERIZATION WITH CORONARY ANGIOGRAM;  Surgeon: Peter M Martinique, MD;  Location: [facility] CATH LAB;  Service: Cardiovascular;  Laterality: N/A;    Social History   Social History  . Marital status: Widowed    Spouse name: N/A  . Number of children: N/A  . Years of education: N/A   Occupational History  . Not on file.   Social History Main Topics  .  Smoking status: Former Smoker    Quit date: 04/26/2006  . Smokeless tobacco: Not on file  . Alcohol use Yes     Comment: occasional  . Drug use: Unknown  . Sexual activity: Not on file   Other Topics Concern  . Not on file   Social History Narrative  . No narrative on file      Prior to Admission medications   Medication Sig Start Date End Date Taking? Authorizing Provider  albuterol (PROVENTIL) (2.5 MG/3ML) 0.083% nebulizer solution Take 2.5 mg by nebulization every 6 (six) hours as needed for wheezing.   Yes Historical Provider, MD  amLODipine (NORVASC) 10 MG tablet Take 10 mg by mouth daily.    Yes Historical Provider, MD  aspirin 81 MG chewable tablet Chew 81 mg by mouth daily.   Yes Historical Provider, MD  Benzyl Alcohol (ULESFIA) 5 % LOTN Apply topically as needed.   Yes Historical Provider, MD  budesonide-formoterol (SYMBICORT) 160-4.5 MCG/ACT inhaler Inhale 2 puffs into the lungs 2 (two) times daily.   Yes Historical Provider, MD  gabapentin (NEURONTIN) 300 MG capsule Take 300 mg by mouth at bedtime.   Yes Historical Provider, MD  glimepiride (AMARYL) 2 MG tablet Take 2 mg by mouth daily before breakfast.   Yes Historical Provider, MD  lisinopril (PRINIVIL,ZESTRIL) 40 MG tablet Take 40 mg by mouth daily.   Yes Historical Provider, MD  metFORMIN (GLUCOPHAGE) 500 MG tablet Take 1 tablet (500 mg total) by mouth 2 (two) times daily with a meal. HOLD for 48 hours, restart on 10/12/2012. 12/04/12  Yes Evelene Croon Barrett, PA-C     Review of systems complete and found to be negative unless listed above in HPI     Physical exam Blood pressure (!) 148/72, pulse 82, height 5\' 4"  (1.626 m), weight 275 lb (124.7 kg), SpO2 95 %. General: NAD Neck: No JVD, no thyromegaly or thyroid nodule.  Lungs: Clear to auscultation bilaterally with normal respiratory effort. CV: Nondisplaced PMI. Regular rate and rhythm, normal S1/S2, no S3/S4, no murmur.  Trace pretibial edema.  No carotid bruit.   Abdomen: Soft, nontender, obese. Skin: Intact without lesions or rashes.  Neurologic: Alert and oriented x 3.  Psych: Normal affect. Extremities: No clubbing or cyanosis.  HEENT: Normal.   ECG: Most recent ECG reviewed.  Labs:   Lab Results  Component Value Date   WBC 6.9 07/31/2008   HGB 12.8 07/31/2008   HCT 36.8 07/31/2008   MCV 90.1 07/31/2008   PLT 188 07/31/2008   No results for input(s): NA, K, CL, CO2, BUN, CREATININE, CALCIUM, PROT, BILITOT, ALKPHOS, ALT, AST, GLUCOSE in the last 168 hours.  Invalid input(s): LABALBU No results found for: CKTOTAL, CKMB, CKMBINDEX, TROPONINI  Lab  Results  Component Value Date   CHOL  07/31/2008    162        ATP III CLASSIFICATION:  <200     mg/dL   Desirable  200-239  mg/dL   Borderline High  >=240    mg/dL   High          Lab Results  Component Value Date   HDL 31 (L) 07/31/2008   Lab Results  Component Value Date   LDLCALC (H) 07/31/2008    109        Total Cholesterol/HDL:CHD Risk Coronary Heart Disease Risk Table                     Men   Women  1/2 Average Risk   3.4   3.3  Average Risk       5.0   4.4  2 X Average Risk   9.6   7.1  3 X Average Risk  23.4   11.0        Use the calculated Patient Ratio above and the CHD Risk Table to determine the patient's CHD Risk.        ATP III CLASSIFICATION (LDL):  <100     mg/dL   Optimal  100-129  mg/dL   Near or Above                    Optimal  130-159  mg/dL   Borderline  160-189  mg/dL   High  >190     mg/dL   Very High   Lab Results  Component Value Date   TRIG 112 07/31/2008   Lab Results  Component Value Date   CHOLHDL 5.2 07/31/2008   No results found for: LDLDIRECT       Studies: No results found.  ASSESSMENT AND PLAN:  1. HTN: Elevated. Will monitor. Meds recently increased by PCP. Possibly being driven by sleep apnea.  2. Exertional dyspnea: I will order a 2-D echocardiogram with Doppler to evaluate cardiac structure, function, and regional wall motion.  3. Palpitations: Will obtain 48 hr Holter monitor.  4. DM II: At least moderate intensity statin therapy is recommended as per guidelines.  Dispo: fu 6 wks   Signed: Kate Sable, M.D., F.A.C.C.  03/12/2016, 2:14 PM

## 2016-03-12 NOTE — Patient Instructions (Signed)
Medication Instructions:  Continue all current medications.  Labwork: none  Testing/Procedures:  Your physician has requested that you have an echocardiogram. Echocardiography is a painless test that uses sound waves to create images of your heart. It provides your doctor with information about the size and shape of your heart and how well your heart's chambers and valves are working. This procedure takes approximately one hour. There are no restrictions for this procedure.  Your physician has recommended that you wear a 48 hour holter monitor. Holter monitors are medical devices that record the heart's electrical activity. Doctors most often use these monitors to diagnose arrhythmias. Arrhythmias are problems with the speed or rhythm of the heartbeat. The monitor is a small, portable device. You can wear one while you do your normal daily activities. This is usually used to diagnose what is causing palpitations/syncope (passing out).  Office will contact with results via phone or letter.    Follow-Up: 6 weeks   Any Other Special Instructions Will Be Listed Below (If Applicable).  If you need a refill on your cardiac medications before your next appointment, please call your pharmacy.

## 2016-03-22 DIAGNOSIS — Z7984 Long term (current) use of oral hypoglycemic drugs: Secondary | ICD-10-CM | POA: Diagnosis not present

## 2016-03-22 DIAGNOSIS — Z79899 Other long term (current) drug therapy: Secondary | ICD-10-CM | POA: Diagnosis not present

## 2016-03-22 DIAGNOSIS — M7989 Other specified soft tissue disorders: Secondary | ICD-10-CM | POA: Diagnosis not present

## 2016-03-22 DIAGNOSIS — Z8673 Personal history of transient ischemic attack (TIA), and cerebral infarction without residual deficits: Secondary | ICD-10-CM | POA: Diagnosis not present

## 2016-03-22 DIAGNOSIS — I82402 Acute embolism and thrombosis of unspecified deep veins of left lower extremity: Secondary | ICD-10-CM | POA: Diagnosis not present

## 2016-03-22 DIAGNOSIS — M797 Fibromyalgia: Secondary | ICD-10-CM | POA: Diagnosis not present

## 2016-03-22 DIAGNOSIS — F329 Major depressive disorder, single episode, unspecified: Secondary | ICD-10-CM | POA: Diagnosis not present

## 2016-03-22 DIAGNOSIS — Z7982 Long term (current) use of aspirin: Secondary | ICD-10-CM | POA: Diagnosis not present

## 2016-03-22 DIAGNOSIS — E119 Type 2 diabetes mellitus without complications: Secondary | ICD-10-CM | POA: Diagnosis not present

## 2016-03-22 DIAGNOSIS — L03116 Cellulitis of left lower limb: Secondary | ICD-10-CM | POA: Diagnosis not present

## 2016-03-22 DIAGNOSIS — I10 Essential (primary) hypertension: Secondary | ICD-10-CM | POA: Diagnosis not present

## 2016-03-23 DIAGNOSIS — M79605 Pain in left leg: Secondary | ICD-10-CM | POA: Diagnosis not present

## 2016-03-23 DIAGNOSIS — R600 Localized edema: Secondary | ICD-10-CM | POA: Diagnosis not present

## 2016-03-24 ENCOUNTER — Encounter (INDEPENDENT_AMBULATORY_CARE_PROVIDER_SITE_OTHER): Payer: Medicare Other

## 2016-03-24 DIAGNOSIS — R0609 Other forms of dyspnea: Secondary | ICD-10-CM

## 2016-03-24 DIAGNOSIS — R002 Palpitations: Secondary | ICD-10-CM

## 2016-04-01 ENCOUNTER — Other Ambulatory Visit: Payer: Self-pay | Admitting: *Deleted

## 2016-04-01 DIAGNOSIS — R002 Palpitations: Secondary | ICD-10-CM

## 2016-04-01 DIAGNOSIS — R0609 Other forms of dyspnea: Principal | ICD-10-CM

## 2016-04-08 ENCOUNTER — Other Ambulatory Visit: Payer: Self-pay

## 2016-04-08 ENCOUNTER — Ambulatory Visit: Payer: Medicare Other

## 2016-04-08 ENCOUNTER — Telehealth: Payer: Self-pay | Admitting: *Deleted

## 2016-04-08 DIAGNOSIS — R0609 Other forms of dyspnea: Principal | ICD-10-CM

## 2016-04-08 DIAGNOSIS — R002 Palpitations: Secondary | ICD-10-CM

## 2016-04-08 NOTE — Telephone Encounter (Signed)
Notes Recorded by Laurine Blazer, LPN on [id number] at 4:30 PM EST Patient notified. Copy to pmd. Follow up scheduled 04/23/2016 with Dr. Bronson Ing. ------

## 2016-04-08 NOTE — Telephone Encounter (Signed)
HOLTER -   Notes Recorded by Herminio Commons, MD on 04/08/2016 at 4:11 PM EST No significant arrhythmias.

## 2016-04-08 NOTE — Telephone Encounter (Signed)
ECHO-   Notes Recorded by Laurine Blazer, LPN on [id number] at 3:47 PM EST Patient will call back, bad cell reception. ------  Notes Recorded by Herminio Commons, MD on 04/08/2016 at 3:33 PM EST Normal pumping function, no significant valve problems.

## 2016-04-23 ENCOUNTER — Encounter: Payer: Self-pay | Admitting: Cardiovascular Disease

## 2016-04-23 ENCOUNTER — Ambulatory Visit (INDEPENDENT_AMBULATORY_CARE_PROVIDER_SITE_OTHER): Payer: Medicare Other | Admitting: Cardiovascular Disease

## 2016-04-23 VITALS — BP 152/80 | HR 87 | Ht 64.0 in | Wt 282.0 lb

## 2016-04-23 DIAGNOSIS — I10 Essential (primary) hypertension: Secondary | ICD-10-CM | POA: Diagnosis not present

## 2016-04-23 DIAGNOSIS — R600 Localized edema: Secondary | ICD-10-CM | POA: Diagnosis not present

## 2016-04-23 DIAGNOSIS — R0609 Other forms of dyspnea: Secondary | ICD-10-CM

## 2016-04-23 DIAGNOSIS — R002 Palpitations: Secondary | ICD-10-CM

## 2016-04-23 MED ORDER — HYDROCHLOROTHIAZIDE 25 MG PO TABS: 25.0000 mg | ORAL_TABLET | Freq: Every day | ORAL | 3 refills | Status: DC

## 2016-04-23 NOTE — Progress Notes (Signed)
SUBJECTIVE: The patient returns for follow-up after undergoing cardiovascular testing performed for the evaluation of exertional dyspnea and palpitations.  Echocardiogram 04/08/16: Normal LV wall thickness with LVEF 65-70% and grade 1 diastolic dysfunction. Upper normal left atrial chamber size. Mild mitral annular calcification with trivial mitral regurgitation. Trivial tricuspid regurgitation with PASP estimated 28 mmHg.  Holter monitoring demonstrated predominantly normal sinus rhythm with occasional PACs and a very short burst of SVT. Symptoms correlated with normal sinus rhythm.  Coronary angiography 10/09/12 showed an ostial 30% stenosis in the ramus.  She continues to complain of bilateral leg swelling in the past 6 months. She also complains of fatigue. She has sleep apnea and has yet to start using CPAP.  Review of Systems: As per "subjective", otherwise negative.  Allergies  Allergen Reactions  . Prednisone Hives and Swelling    (Steroid meds)  . Sulfa Antibiotics Hives  . Zocor [Simvastatin]     Aching     Current Outpatient Prescriptions  Medication Sig Dispense Refill  . albuterol (PROVENTIL) (2.5 MG/3ML) 0.083% nebulizer solution Take 2.5 mg by nebulization every 6 (six) hours as needed for wheezing.    Marland Kitchen amLODipine (NORVASC) 10 MG tablet Take 10 mg by mouth daily.    Marland Kitchen aspirin 81 MG chewable tablet Chew 81 mg by mouth daily.    . Benzyl Alcohol (ULESFIA) 5 % LOTN Apply topically as needed.    . budesonide-formoterol (SYMBICORT) 160-4.5 MCG/ACT inhaler Inhale 2 puffs into the lungs 2 (two) times daily as needed.     . gabapentin (NEURONTIN) 300 MG capsule Take 300 mg by mouth at bedtime as needed.     Marland Kitchen glimepiride (AMARYL) 2 MG tablet Take 2 mg by mouth daily before breakfast.    . lisinopril (PRINIVIL,ZESTRIL) 40 MG tablet Take 40 mg by mouth daily.    . metFORMIN (GLUCOPHAGE) 500 MG tablet Take 1 tablet (500 mg total) by mouth 2 (two) times daily with a  meal. HOLD for 48 hours, restart on 10/12/2012. 60 tablet 3   No current facility-administered medications for this visit.     Past Medical History:  Diagnosis Date  . Asthma   . Back pain   . Cerebrovascular disease   . Constipation   . Diabetes ([place])   . Fibromyalgia   . H/O colonoscopy 04/12/2014   polp, diverticulosis, Dr. Britta Mccreedy  . H/O mammogram 11/2014   normal  . HTN (hypertension)   . Hyperlipidemia   . Lumbago   . Obesity   . Paronychia of toe   . Snoring     Past Surgical History:  Procedure Laterality Date  . LEFT HEART CATHETERIZATION WITH CORONARY ANGIOGRAM N/A 10/09/2012   Procedure: LEFT HEART CATHETERIZATION WITH CORONARY ANGIOGRAM;  Surgeon: Peter M Martinique, MD;  Location: [facility] CATH LAB;  Service: Cardiovascular;  Laterality: N/A;    Social History   Social History  . Marital status: Widowed    Spouse name: N/A  . Number of children: N/A  . Years of education: N/A   Occupational History  . Not on file.   Social History Main Topics  . Smoking status: Former Smoker    Packs/day: 2.00    Years: 12.00    Types: Cigarettes    Start date: 06/19/1965    Quit date: 04/26/1977  . Smokeless tobacco: Never Used  . Alcohol use Yes     Comment: occasional  . Drug use: Unknown  . Sexual activity: Not on file  Other Topics Concern  . Not on file   Social History Narrative  . No narrative on file     Vitals:   04/23/16 1317  BP: (!) 152/80  Pulse: 87  SpO2: 96%  Weight: 282 lb (127.9 kg)  Height: 5\' 4"  (1.626 m)    PHYSICAL EXAM General: NAD Neck: No JVD, no thyromegaly or thyroid nodule.  Lungs: Clear to auscultation bilaterally with normal respiratory effort. CV: Nondisplaced PMI. Regular rate and rhythm, normal S1/S2, no S3/S4, no murmur.  1-2+ pitting pretibial edema b/l.    Abdomen: Soft, nontender, obese. Skin: Intact without lesions or rashes.  Neurologic: Alert and oriented x 3.  Psych: Normal affect. Extremities: No clubbing or  cyanosis.  HEENT: Normal.     ECG: Most recent ECG reviewed.      ASSESSMENT AND PLAN: 1. HTN: Elevated again today. Possibly being driven by sleep apnea. Will add HCTZ 25 mg daily and check BMET 04/27/16.  2. Exertional dyspnea: Likely related to obesity and hypoventilation syndrome. Noncardiac in etiology..  3. Palpitations: No significant arrhythmias.  4. DM II: At least moderate intensity statin therapy is recommended as per guidelines.  5. Bilateral leg edema: Likely related to obesity, OSA, and HTN. Will add HCTZ 25 mg daily and check BMET 04/27/16.   Dispo: fu prn   Kate Sable, M.D., F.A.C.C.

## 2016-04-23 NOTE — Patient Instructions (Signed)
Your physician recommends that you schedule a follow-up appointment in: AS NEEDED WITH DR. Bronson Ing  Your physician has recommended you make the following change in your medication:   START HYDROCHLOROTHIAZIDE 25 MG DAILY  Your physician recommends that you return for lab work ON Tuesday 04/27/16 BMP   Thank you for choosing [facility]!!

## 2016-04-27 ENCOUNTER — Ambulatory Visit: Payer: Medicare Other | Admitting: Cardiovascular Disease

## 2016-04-29 DIAGNOSIS — Z6841 Body Mass Index (BMI) 40.0 and over, adult: Secondary | ICD-10-CM | POA: Diagnosis not present

## 2016-04-29 DIAGNOSIS — E7800 Pure hypercholesterolemia, unspecified: Secondary | ICD-10-CM | POA: Diagnosis not present

## 2016-04-29 DIAGNOSIS — J069 Acute upper respiratory infection, unspecified: Secondary | ICD-10-CM | POA: Diagnosis not present

## 2016-04-29 DIAGNOSIS — I10 Essential (primary) hypertension: Secondary | ICD-10-CM | POA: Diagnosis not present

## 2016-04-29 DIAGNOSIS — Z299 Encounter for prophylactic measures, unspecified: Secondary | ICD-10-CM | POA: Diagnosis not present

## 2016-04-29 DIAGNOSIS — Z87891 Personal history of nicotine dependence: Secondary | ICD-10-CM | POA: Diagnosis not present

## 2016-04-29 DIAGNOSIS — E1165 Type 2 diabetes mellitus with hyperglycemia: Secondary | ICD-10-CM | POA: Diagnosis not present

## 2016-04-30 ENCOUNTER — Telehealth: Payer: Self-pay | Admitting: Cardiovascular Disease

## 2016-04-30 NOTE — Telephone Encounter (Signed)
Pt says she has touch of pneumonia pcp started Levaquin - aware that ok to wait until next week for lab work

## 2016-04-30 NOTE — Telephone Encounter (Signed)
Patient sick and get get bloodwork done.  Would like to know if next week would be ok to do

## 2016-05-20 ENCOUNTER — Telehealth: Payer: Self-pay | Admitting: *Deleted

## 2016-05-20 NOTE — Telephone Encounter (Signed)
Was started on BP med recently at last OV on 04/23/2016.  Stated she did not start because she was scared it would hurt her kidneys.  Explained to patient that is the reason we do labs after beginning these types of medications.  Advised that she go ahead & begin so that her BP will be bette controlled.  Can do lab 5-7 days AFTER beginning the HCTZ.  Informed patient that the lab will check her sodium, potassium & kidney function.  Will notify her of results as soon as we have results available.  Reminded her that she can also continue to monitor BP readings at home & that if she should have any symptoms of dizziness, lightheadedness to let us know.  Patient verbalized understanding.

## 2016-05-28 DIAGNOSIS — I10 Essential (primary) hypertension: Secondary | ICD-10-CM | POA: Diagnosis not present

## 2016-07-09 DIAGNOSIS — G4710 Hypersomnia, unspecified: Secondary | ICD-10-CM | POA: Diagnosis not present

## 2016-07-09 DIAGNOSIS — Z713 Dietary counseling and surveillance: Secondary | ICD-10-CM | POA: Diagnosis not present

## 2016-07-09 DIAGNOSIS — E1165 Type 2 diabetes mellitus with hyperglycemia: Secondary | ICD-10-CM | POA: Diagnosis not present

## 2016-07-09 DIAGNOSIS — E7800 Pure hypercholesterolemia, unspecified: Secondary | ICD-10-CM | POA: Diagnosis not present

## 2016-07-09 DIAGNOSIS — Z6841 Body Mass Index (BMI) 40.0 and over, adult: Secondary | ICD-10-CM | POA: Diagnosis not present

## 2016-07-09 DIAGNOSIS — I679 Cerebrovascular disease, unspecified: Secondary | ICD-10-CM | POA: Diagnosis not present

## 2016-07-09 DIAGNOSIS — Z299 Encounter for prophylactic measures, unspecified: Secondary | ICD-10-CM | POA: Diagnosis not present

## 2016-07-09 DIAGNOSIS — I10 Essential (primary) hypertension: Secondary | ICD-10-CM | POA: Diagnosis not present

## 2016-07-16 DIAGNOSIS — M1712 Unilateral primary osteoarthritis, left knee: Secondary | ICD-10-CM | POA: Diagnosis not present

## 2016-07-16 DIAGNOSIS — Z713 Dietary counseling and surveillance: Secondary | ICD-10-CM | POA: Diagnosis not present

## 2016-07-16 DIAGNOSIS — G4710 Hypersomnia, unspecified: Secondary | ICD-10-CM | POA: Diagnosis not present

## 2016-07-16 DIAGNOSIS — M25561 Pain in right knee: Secondary | ICD-10-CM | POA: Diagnosis not present

## 2016-07-16 DIAGNOSIS — M25562 Pain in left knee: Secondary | ICD-10-CM | POA: Diagnosis not present

## 2016-07-16 DIAGNOSIS — Z6841 Body Mass Index (BMI) 40.0 and over, adult: Secondary | ICD-10-CM | POA: Diagnosis not present

## 2016-07-16 DIAGNOSIS — I10 Essential (primary) hypertension: Secondary | ICD-10-CM | POA: Diagnosis not present

## 2016-07-16 DIAGNOSIS — E1165 Type 2 diabetes mellitus with hyperglycemia: Secondary | ICD-10-CM | POA: Diagnosis not present

## 2016-07-16 DIAGNOSIS — Z299 Encounter for prophylactic measures, unspecified: Secondary | ICD-10-CM | POA: Diagnosis not present

## 2016-07-16 DIAGNOSIS — M1711 Unilateral primary osteoarthritis, right knee: Secondary | ICD-10-CM | POA: Diagnosis not present

## 2016-07-18 DIAGNOSIS — G4730 Sleep apnea, unspecified: Secondary | ICD-10-CM | POA: Diagnosis not present

## 2016-08-12 DIAGNOSIS — Z79899 Other long term (current) drug therapy: Secondary | ICD-10-CM | POA: Diagnosis not present

## 2016-08-12 DIAGNOSIS — I10 Essential (primary) hypertension: Secondary | ICD-10-CM | POA: Diagnosis not present

## 2016-08-12 DIAGNOSIS — L03012 Cellulitis of left finger: Secondary | ICD-10-CM | POA: Diagnosis not present

## 2016-08-12 DIAGNOSIS — E1140 Type 2 diabetes mellitus with diabetic neuropathy, unspecified: Secondary | ICD-10-CM | POA: Diagnosis not present

## 2016-08-12 DIAGNOSIS — Z7984 Long term (current) use of oral hypoglycemic drugs: Secondary | ICD-10-CM | POA: Diagnosis not present

## 2016-08-12 DIAGNOSIS — Z7982 Long term (current) use of aspirin: Secondary | ICD-10-CM | POA: Diagnosis not present

## 2016-08-12 DIAGNOSIS — M7989 Other specified soft tissue disorders: Secondary | ICD-10-CM | POA: Diagnosis not present

## 2016-08-12 DIAGNOSIS — M797 Fibromyalgia: Secondary | ICD-10-CM | POA: Diagnosis not present

## 2016-08-12 DIAGNOSIS — E119 Type 2 diabetes mellitus without complications: Secondary | ICD-10-CM | POA: Diagnosis not present

## 2016-08-12 DIAGNOSIS — Z8673 Personal history of transient ischemic attack (TIA), and cerebral infarction without residual deficits: Secondary | ICD-10-CM | POA: Diagnosis not present

## 2016-08-13 DIAGNOSIS — Z4801 Encounter for change or removal of surgical wound dressing: Secondary | ICD-10-CM | POA: Diagnosis not present

## 2016-08-13 DIAGNOSIS — Z4800 Encounter for change or removal of nonsurgical wound dressing: Secondary | ICD-10-CM | POA: Diagnosis not present

## 2016-08-13 DIAGNOSIS — I10 Essential (primary) hypertension: Secondary | ICD-10-CM | POA: Diagnosis not present

## 2016-08-13 DIAGNOSIS — Z7984 Long term (current) use of oral hypoglycemic drugs: Secondary | ICD-10-CM | POA: Diagnosis not present

## 2016-08-13 DIAGNOSIS — E119 Type 2 diabetes mellitus without complications: Secondary | ICD-10-CM | POA: Diagnosis not present

## 2016-08-13 DIAGNOSIS — Z7982 Long term (current) use of aspirin: Secondary | ICD-10-CM | POA: Diagnosis not present

## 2016-08-13 DIAGNOSIS — Z79899 Other long term (current) drug therapy: Secondary | ICD-10-CM | POA: Diagnosis not present

## 2016-08-13 DIAGNOSIS — L02512 Cutaneous abscess of left hand: Secondary | ICD-10-CM | POA: Diagnosis not present

## 2016-08-13 DIAGNOSIS — M797 Fibromyalgia: Secondary | ICD-10-CM | POA: Diagnosis not present

## 2016-08-27 DIAGNOSIS — Z713 Dietary counseling and surveillance: Secondary | ICD-10-CM | POA: Diagnosis not present

## 2016-08-27 DIAGNOSIS — E7800 Pure hypercholesterolemia, unspecified: Secondary | ICD-10-CM | POA: Diagnosis not present

## 2016-08-27 DIAGNOSIS — Z6841 Body Mass Index (BMI) 40.0 and over, adult: Secondary | ICD-10-CM | POA: Diagnosis not present

## 2016-08-27 DIAGNOSIS — L03012 Cellulitis of left finger: Secondary | ICD-10-CM | POA: Diagnosis not present

## 2016-08-27 DIAGNOSIS — I10 Essential (primary) hypertension: Secondary | ICD-10-CM | POA: Diagnosis not present

## 2016-08-27 DIAGNOSIS — Z299 Encounter for prophylactic measures, unspecified: Secondary | ICD-10-CM | POA: Diagnosis not present

## 2016-08-27 DIAGNOSIS — E1165 Type 2 diabetes mellitus with hyperglycemia: Secondary | ICD-10-CM | POA: Diagnosis not present

## 2016-08-27 DIAGNOSIS — J45909 Unspecified asthma, uncomplicated: Secondary | ICD-10-CM | POA: Diagnosis not present

## 2016-10-15 DIAGNOSIS — Z6841 Body Mass Index (BMI) 40.0 and over, adult: Secondary | ICD-10-CM | POA: Diagnosis not present

## 2016-10-15 DIAGNOSIS — Z299 Encounter for prophylactic measures, unspecified: Secondary | ICD-10-CM | POA: Diagnosis not present

## 2016-10-15 DIAGNOSIS — I10 Essential (primary) hypertension: Secondary | ICD-10-CM | POA: Diagnosis not present

## 2016-10-15 DIAGNOSIS — E7800 Pure hypercholesterolemia, unspecified: Secondary | ICD-10-CM | POA: Diagnosis not present

## 2016-10-15 DIAGNOSIS — G4710 Hypersomnia, unspecified: Secondary | ICD-10-CM | POA: Diagnosis not present

## 2016-10-15 DIAGNOSIS — J45909 Unspecified asthma, uncomplicated: Secondary | ICD-10-CM | POA: Diagnosis not present

## 2016-10-15 DIAGNOSIS — I679 Cerebrovascular disease, unspecified: Secondary | ICD-10-CM | POA: Diagnosis not present

## 2016-10-15 DIAGNOSIS — E1165 Type 2 diabetes mellitus with hyperglycemia: Secondary | ICD-10-CM | POA: Diagnosis not present

## 2016-10-15 DIAGNOSIS — Z713 Dietary counseling and surveillance: Secondary | ICD-10-CM | POA: Diagnosis not present

## 2017-03-02 DIAGNOSIS — L729 Follicular cyst of the skin and subcutaneous tissue, unspecified: Secondary | ICD-10-CM | POA: Diagnosis not present

## 2017-03-02 DIAGNOSIS — N6311 Unspecified lump in the right breast, upper outer quadrant: Secondary | ICD-10-CM | POA: Diagnosis not present

## 2017-03-02 DIAGNOSIS — Z6841 Body Mass Index (BMI) 40.0 and over, adult: Secondary | ICD-10-CM | POA: Diagnosis not present

## 2017-03-02 DIAGNOSIS — E1165 Type 2 diabetes mellitus with hyperglycemia: Secondary | ICD-10-CM | POA: Diagnosis not present

## 2017-03-02 DIAGNOSIS — I10 Essential (primary) hypertension: Secondary | ICD-10-CM | POA: Diagnosis not present

## 2017-03-02 DIAGNOSIS — Z299 Encounter for prophylactic measures, unspecified: Secondary | ICD-10-CM | POA: Diagnosis not present

## 2017-03-07 DIAGNOSIS — R531 Weakness: Secondary | ICD-10-CM | POA: Diagnosis not present

## 2017-03-07 DIAGNOSIS — L728 Other follicular cysts of the skin and subcutaneous tissue: Secondary | ICD-10-CM | POA: Diagnosis not present

## 2017-03-07 DIAGNOSIS — R220 Localized swelling, mass and lump, head: Secondary | ICD-10-CM | POA: Diagnosis not present

## 2017-03-07 DIAGNOSIS — R51 Headache: Secondary | ICD-10-CM | POA: Diagnosis not present

## 2017-03-09 DIAGNOSIS — R928 Other abnormal and inconclusive findings on diagnostic imaging of breast: Secondary | ICD-10-CM | POA: Diagnosis not present

## 2017-03-09 DIAGNOSIS — N6311 Unspecified lump in the right breast, upper outer quadrant: Secondary | ICD-10-CM | POA: Diagnosis not present

## 2017-03-09 DIAGNOSIS — N6489 Other specified disorders of breast: Secondary | ICD-10-CM | POA: Diagnosis not present

## 2017-06-22 DIAGNOSIS — I679 Cerebrovascular disease, unspecified: Secondary | ICD-10-CM | POA: Diagnosis not present

## 2017-06-22 DIAGNOSIS — Z6841 Body Mass Index (BMI) 40.0 and over, adult: Secondary | ICD-10-CM | POA: Diagnosis not present

## 2017-06-22 DIAGNOSIS — Z87891 Personal history of nicotine dependence: Secondary | ICD-10-CM | POA: Diagnosis not present

## 2017-06-22 DIAGNOSIS — E1165 Type 2 diabetes mellitus with hyperglycemia: Secondary | ICD-10-CM | POA: Diagnosis not present

## 2017-06-22 DIAGNOSIS — G4730 Sleep apnea, unspecified: Secondary | ICD-10-CM | POA: Diagnosis not present

## 2017-06-22 DIAGNOSIS — K5900 Constipation, unspecified: Secondary | ICD-10-CM | POA: Diagnosis not present

## 2017-06-22 DIAGNOSIS — I10 Essential (primary) hypertension: Secondary | ICD-10-CM | POA: Diagnosis not present

## 2017-06-22 DIAGNOSIS — Z299 Encounter for prophylactic measures, unspecified: Secondary | ICD-10-CM | POA: Diagnosis not present

## 2017-07-18 DIAGNOSIS — S80862A Insect bite (nonvenomous), left lower leg, initial encounter: Secondary | ICD-10-CM | POA: Diagnosis not present

## 2017-07-18 DIAGNOSIS — I10 Essential (primary) hypertension: Secondary | ICD-10-CM | POA: Diagnosis not present

## 2017-07-18 DIAGNOSIS — Z7984 Long term (current) use of oral hypoglycemic drugs: Secondary | ICD-10-CM | POA: Diagnosis not present

## 2017-07-18 DIAGNOSIS — W540XXA Bitten by dog, initial encounter: Secondary | ICD-10-CM | POA: Diagnosis not present

## 2017-07-18 DIAGNOSIS — Z87891 Personal history of nicotine dependence: Secondary | ICD-10-CM | POA: Diagnosis not present

## 2017-07-18 DIAGNOSIS — T63301A Toxic effect of unspecified spider venom, accidental (unintentional), initial encounter: Secondary | ICD-10-CM | POA: Diagnosis not present

## 2017-07-18 DIAGNOSIS — Z79899 Other long term (current) drug therapy: Secondary | ICD-10-CM | POA: Diagnosis not present

## 2017-07-18 DIAGNOSIS — Z7982 Long term (current) use of aspirin: Secondary | ICD-10-CM | POA: Diagnosis not present

## 2017-07-18 DIAGNOSIS — E1140 Type 2 diabetes mellitus with diabetic neuropathy, unspecified: Secondary | ICD-10-CM | POA: Diagnosis not present

## 2017-07-18 DIAGNOSIS — M797 Fibromyalgia: Secondary | ICD-10-CM | POA: Diagnosis not present

## 2017-07-18 DIAGNOSIS — F329 Major depressive disorder, single episode, unspecified: Secondary | ICD-10-CM | POA: Diagnosis not present

## 2017-07-18 DIAGNOSIS — F419 Anxiety disorder, unspecified: Secondary | ICD-10-CM | POA: Diagnosis not present

## 2017-08-07 DIAGNOSIS — E1140 Type 2 diabetes mellitus with diabetic neuropathy, unspecified: Secondary | ICD-10-CM | POA: Diagnosis not present

## 2017-08-07 DIAGNOSIS — S8992XA Unspecified injury of left lower leg, initial encounter: Secondary | ICD-10-CM | POA: Diagnosis not present

## 2017-08-07 DIAGNOSIS — Z7984 Long term (current) use of oral hypoglycemic drugs: Secondary | ICD-10-CM | POA: Diagnosis not present

## 2017-08-07 DIAGNOSIS — M25562 Pain in left knee: Secondary | ICD-10-CM | POA: Diagnosis not present

## 2017-08-07 DIAGNOSIS — F419 Anxiety disorder, unspecified: Secondary | ICD-10-CM | POA: Diagnosis not present

## 2017-08-07 DIAGNOSIS — I10 Essential (primary) hypertension: Secondary | ICD-10-CM | POA: Diagnosis not present

## 2017-08-07 DIAGNOSIS — S46912A Strain of unspecified muscle, fascia and tendon at shoulder and upper arm level, left arm, initial encounter: Secondary | ICD-10-CM | POA: Diagnosis not present

## 2017-08-07 DIAGNOSIS — M25512 Pain in left shoulder: Secondary | ICD-10-CM | POA: Diagnosis not present

## 2017-08-07 DIAGNOSIS — Z79899 Other long term (current) drug therapy: Secondary | ICD-10-CM | POA: Diagnosis not present

## 2017-08-07 DIAGNOSIS — M1990 Unspecified osteoarthritis, unspecified site: Secondary | ICD-10-CM | POA: Diagnosis not present

## 2017-08-07 DIAGNOSIS — M797 Fibromyalgia: Secondary | ICD-10-CM | POA: Diagnosis not present

## 2017-08-07 DIAGNOSIS — S8002XA Contusion of left knee, initial encounter: Secondary | ICD-10-CM | POA: Diagnosis not present

## 2017-08-07 DIAGNOSIS — W01198A Fall on same level from slipping, tripping and stumbling with subsequent striking against other object, initial encounter: Secondary | ICD-10-CM | POA: Diagnosis not present

## 2017-08-07 DIAGNOSIS — Z8673 Personal history of transient ischemic attack (TIA), and cerebral infarction without residual deficits: Secondary | ICD-10-CM | POA: Diagnosis not present

## 2017-08-07 DIAGNOSIS — S4992XA Unspecified injury of left shoulder and upper arm, initial encounter: Secondary | ICD-10-CM | POA: Diagnosis not present

## 2017-08-07 DIAGNOSIS — Z87891 Personal history of nicotine dependence: Secondary | ICD-10-CM | POA: Diagnosis not present

## 2017-08-07 DIAGNOSIS — Z7982 Long term (current) use of aspirin: Secondary | ICD-10-CM | POA: Diagnosis not present

## 2017-09-21 DIAGNOSIS — D225 Melanocytic nevi of trunk: Secondary | ICD-10-CM | POA: Diagnosis not present

## 2017-09-21 DIAGNOSIS — L821 Other seborrheic keratosis: Secondary | ICD-10-CM | POA: Diagnosis not present

## 2017-09-21 DIAGNOSIS — L570 Actinic keratosis: Secondary | ICD-10-CM | POA: Diagnosis not present

## 2017-09-21 DIAGNOSIS — D485 Neoplasm of uncertain behavior of skin: Secondary | ICD-10-CM | POA: Diagnosis not present

## 2017-09-21 DIAGNOSIS — D1801 Hemangioma of skin and subcutaneous tissue: Secondary | ICD-10-CM | POA: Diagnosis not present

## 2017-09-28 DIAGNOSIS — L0100 Impetigo, unspecified: Secondary | ICD-10-CM | POA: Diagnosis not present

## 2017-10-13 DIAGNOSIS — L988 Other specified disorders of the skin and subcutaneous tissue: Secondary | ICD-10-CM | POA: Diagnosis not present

## 2017-10-13 DIAGNOSIS — D485 Neoplasm of uncertain behavior of skin: Secondary | ICD-10-CM | POA: Diagnosis not present

## 2017-10-26 DIAGNOSIS — Z4802 Encounter for removal of sutures: Secondary | ICD-10-CM | POA: Diagnosis not present

## 2017-11-25 DIAGNOSIS — R079 Chest pain, unspecified: Secondary | ICD-10-CM | POA: Diagnosis not present

## 2017-11-25 DIAGNOSIS — R55 Syncope and collapse: Secondary | ICD-10-CM | POA: Diagnosis not present

## 2017-11-25 DIAGNOSIS — G4730 Sleep apnea, unspecified: Secondary | ICD-10-CM | POA: Diagnosis not present

## 2017-11-25 DIAGNOSIS — E119 Type 2 diabetes mellitus without complications: Secondary | ICD-10-CM | POA: Diagnosis not present

## 2017-11-25 DIAGNOSIS — I10 Essential (primary) hypertension: Secondary | ICD-10-CM | POA: Diagnosis not present

## 2017-11-25 DIAGNOSIS — R918 Other nonspecific abnormal finding of lung field: Secondary | ICD-10-CM | POA: Diagnosis not present

## 2017-11-25 DIAGNOSIS — Z882 Allergy status to sulfonamides status: Secondary | ICD-10-CM | POA: Diagnosis not present

## 2017-11-25 DIAGNOSIS — R42 Dizziness and giddiness: Secondary | ICD-10-CM | POA: Diagnosis not present

## 2017-11-25 DIAGNOSIS — Z8673 Personal history of transient ischemic attack (TIA), and cerebral infarction without residual deficits: Secondary | ICD-10-CM | POA: Diagnosis not present

## 2017-11-25 DIAGNOSIS — Z87891 Personal history of nicotine dependence: Secondary | ICD-10-CM | POA: Diagnosis not present

## 2017-11-25 DIAGNOSIS — J9811 Atelectasis: Secondary | ICD-10-CM | POA: Diagnosis not present

## 2017-11-25 DIAGNOSIS — R404 Transient alteration of awareness: Secondary | ICD-10-CM | POA: Diagnosis not present

## 2017-11-25 DIAGNOSIS — I2510 Atherosclerotic heart disease of native coronary artery without angina pectoris: Secondary | ICD-10-CM | POA: Diagnosis not present

## 2018-01-24 DIAGNOSIS — R42 Dizziness and giddiness: Secondary | ICD-10-CM | POA: Diagnosis not present

## 2018-01-24 DIAGNOSIS — E1165 Type 2 diabetes mellitus with hyperglycemia: Secondary | ICD-10-CM | POA: Diagnosis not present

## 2018-01-24 DIAGNOSIS — Z299 Encounter for prophylactic measures, unspecified: Secondary | ICD-10-CM | POA: Diagnosis not present

## 2018-01-24 DIAGNOSIS — I10 Essential (primary) hypertension: Secondary | ICD-10-CM | POA: Diagnosis not present

## 2018-01-24 DIAGNOSIS — Z23 Encounter for immunization: Secondary | ICD-10-CM | POA: Diagnosis not present

## 2018-01-24 DIAGNOSIS — Z6841 Body Mass Index (BMI) 40.0 and over, adult: Secondary | ICD-10-CM | POA: Diagnosis not present

## 2018-02-07 DIAGNOSIS — E1140 Type 2 diabetes mellitus with diabetic neuropathy, unspecified: Secondary | ICD-10-CM | POA: Diagnosis not present

## 2018-02-07 DIAGNOSIS — H81393 Other peripheral vertigo, bilateral: Secondary | ICD-10-CM | POA: Diagnosis not present

## 2018-02-07 DIAGNOSIS — E1159 Type 2 diabetes mellitus with other circulatory complications: Secondary | ICD-10-CM | POA: Diagnosis not present

## 2018-02-21 DIAGNOSIS — I10 Essential (primary) hypertension: Secondary | ICD-10-CM | POA: Diagnosis not present

## 2018-02-21 DIAGNOSIS — E1165 Type 2 diabetes mellitus with hyperglycemia: Secondary | ICD-10-CM | POA: Diagnosis not present

## 2018-02-21 DIAGNOSIS — Z299 Encounter for prophylactic measures, unspecified: Secondary | ICD-10-CM | POA: Diagnosis not present

## 2018-02-21 DIAGNOSIS — Z6841 Body Mass Index (BMI) 40.0 and over, adult: Secondary | ICD-10-CM | POA: Diagnosis not present

## 2018-02-21 DIAGNOSIS — J069 Acute upper respiratory infection, unspecified: Secondary | ICD-10-CM | POA: Diagnosis not present

## 2018-02-21 DIAGNOSIS — G4730 Sleep apnea, unspecified: Secondary | ICD-10-CM | POA: Diagnosis not present

## 2018-03-03 DIAGNOSIS — I10 Essential (primary) hypertension: Secondary | ICD-10-CM | POA: Diagnosis not present

## 2018-03-03 DIAGNOSIS — R5383 Other fatigue: Secondary | ICD-10-CM | POA: Diagnosis not present

## 2018-03-03 DIAGNOSIS — E7800 Pure hypercholesterolemia, unspecified: Secondary | ICD-10-CM | POA: Diagnosis not present

## 2018-03-03 DIAGNOSIS — Z7189 Other specified counseling: Secondary | ICD-10-CM | POA: Diagnosis not present

## 2018-03-03 DIAGNOSIS — Z79899 Other long term (current) drug therapy: Secondary | ICD-10-CM | POA: Diagnosis not present

## 2018-03-03 DIAGNOSIS — Z1211 Encounter for screening for malignant neoplasm of colon: Secondary | ICD-10-CM | POA: Diagnosis not present

## 2018-03-03 DIAGNOSIS — Z6841 Body Mass Index (BMI) 40.0 and over, adult: Secondary | ICD-10-CM | POA: Diagnosis not present

## 2018-03-03 DIAGNOSIS — Z1331 Encounter for screening for depression: Secondary | ICD-10-CM | POA: Diagnosis not present

## 2018-03-03 DIAGNOSIS — R42 Dizziness and giddiness: Secondary | ICD-10-CM | POA: Diagnosis not present

## 2018-03-03 DIAGNOSIS — Z299 Encounter for prophylactic measures, unspecified: Secondary | ICD-10-CM | POA: Diagnosis not present

## 2018-03-03 DIAGNOSIS — E559 Vitamin D deficiency, unspecified: Secondary | ICD-10-CM | POA: Diagnosis not present

## 2018-03-03 DIAGNOSIS — Z0000 Encounter for general adult medical examination without abnormal findings: Secondary | ICD-10-CM | POA: Diagnosis not present

## 2018-03-03 DIAGNOSIS — Z1339 Encounter for screening examination for other mental health and behavioral disorders: Secondary | ICD-10-CM | POA: Diagnosis not present

## 2018-03-07 DIAGNOSIS — Z6841 Body Mass Index (BMI) 40.0 and over, adult: Secondary | ICD-10-CM | POA: Diagnosis not present

## 2018-03-07 DIAGNOSIS — J45909 Unspecified asthma, uncomplicated: Secondary | ICD-10-CM | POA: Diagnosis not present

## 2018-03-07 DIAGNOSIS — J329 Chronic sinusitis, unspecified: Secondary | ICD-10-CM | POA: Diagnosis not present

## 2018-03-07 DIAGNOSIS — B9689 Other specified bacterial agents as the cause of diseases classified elsewhere: Secondary | ICD-10-CM | POA: Diagnosis not present

## 2018-03-07 DIAGNOSIS — Z299 Encounter for prophylactic measures, unspecified: Secondary | ICD-10-CM | POA: Diagnosis not present

## 2018-03-07 DIAGNOSIS — J309 Allergic rhinitis, unspecified: Secondary | ICD-10-CM | POA: Diagnosis not present

## 2018-03-07 DIAGNOSIS — I10 Essential (primary) hypertension: Secondary | ICD-10-CM | POA: Diagnosis not present

## 2018-04-04 DIAGNOSIS — G4730 Sleep apnea, unspecified: Secondary | ICD-10-CM | POA: Diagnosis not present

## 2018-04-04 DIAGNOSIS — I10 Essential (primary) hypertension: Secondary | ICD-10-CM | POA: Diagnosis not present

## 2018-04-04 DIAGNOSIS — R42 Dizziness and giddiness: Secondary | ICD-10-CM | POA: Diagnosis not present

## 2018-04-04 DIAGNOSIS — Z6841 Body Mass Index (BMI) 40.0 and over, adult: Secondary | ICD-10-CM | POA: Diagnosis not present

## 2018-04-04 DIAGNOSIS — E1165 Type 2 diabetes mellitus with hyperglycemia: Secondary | ICD-10-CM | POA: Diagnosis not present

## 2018-04-04 DIAGNOSIS — Z299 Encounter for prophylactic measures, unspecified: Secondary | ICD-10-CM | POA: Diagnosis not present

## 2018-07-25 DIAGNOSIS — I10 Essential (primary) hypertension: Secondary | ICD-10-CM | POA: Diagnosis not present

## 2018-07-25 DIAGNOSIS — R42 Dizziness and giddiness: Secondary | ICD-10-CM | POA: Diagnosis not present

## 2018-07-25 DIAGNOSIS — Z87891 Personal history of nicotine dependence: Secondary | ICD-10-CM | POA: Diagnosis not present

## 2018-07-25 DIAGNOSIS — Z6841 Body Mass Index (BMI) 40.0 and over, adult: Secondary | ICD-10-CM | POA: Diagnosis not present

## 2018-07-25 DIAGNOSIS — E1165 Type 2 diabetes mellitus with hyperglycemia: Secondary | ICD-10-CM | POA: Diagnosis not present

## 2018-07-25 DIAGNOSIS — Z299 Encounter for prophylactic measures, unspecified: Secondary | ICD-10-CM | POA: Diagnosis not present

## 2018-07-28 DIAGNOSIS — R0683 Snoring: Secondary | ICD-10-CM | POA: Diagnosis not present

## 2018-07-28 DIAGNOSIS — H109 Unspecified conjunctivitis: Secondary | ICD-10-CM | POA: Diagnosis not present

## 2018-07-28 DIAGNOSIS — Z299 Encounter for prophylactic measures, unspecified: Secondary | ICD-10-CM | POA: Diagnosis not present

## 2018-07-28 DIAGNOSIS — Z6841 Body Mass Index (BMI) 40.0 and over, adult: Secondary | ICD-10-CM | POA: Diagnosis not present

## 2018-07-28 DIAGNOSIS — I10 Essential (primary) hypertension: Secondary | ICD-10-CM | POA: Diagnosis not present

## 2018-12-13 DIAGNOSIS — R42 Dizziness and giddiness: Secondary | ICD-10-CM | POA: Diagnosis not present

## 2018-12-13 DIAGNOSIS — E1159 Type 2 diabetes mellitus with other circulatory complications: Secondary | ICD-10-CM | POA: Diagnosis not present

## 2018-12-13 DIAGNOSIS — I10 Essential (primary) hypertension: Secondary | ICD-10-CM | POA: Diagnosis not present

## 2018-12-13 DIAGNOSIS — E1165 Type 2 diabetes mellitus with hyperglycemia: Secondary | ICD-10-CM | POA: Diagnosis not present

## 2018-12-13 DIAGNOSIS — Z6841 Body Mass Index (BMI) 40.0 and over, adult: Secondary | ICD-10-CM | POA: Diagnosis not present

## 2018-12-13 DIAGNOSIS — Z299 Encounter for prophylactic measures, unspecified: Secondary | ICD-10-CM | POA: Diagnosis not present

## 2018-12-15 ENCOUNTER — Encounter: Payer: Self-pay | Admitting: Neurology
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
## 2018-12-26 ENCOUNTER — Encounter: Payer: Self-pay | Admitting: Neurology

## 2018-12-26 ENCOUNTER — Other Ambulatory Visit: Payer: Self-pay

## 2018-12-26 ENCOUNTER — Ambulatory Visit (INDEPENDENT_AMBULATORY_CARE_PROVIDER_SITE_OTHER): Payer: Medicare Other | Admitting: Neurology

## 2018-12-26 ENCOUNTER — Telehealth: Payer: Self-pay | Admitting: *Deleted

## 2018-12-26 VITALS — BP 153/81 | HR 98 | Temp 98.4°F | Ht 63.0 in | Wt 249.0 lb

## 2018-12-26 DIAGNOSIS — Z8673 Personal history of transient ischemic attack (TIA), and cerebral infarction without residual deficits: Secondary | ICD-10-CM | POA: Diagnosis not present

## 2018-12-26 DIAGNOSIS — R42 Dizziness and giddiness: Secondary | ICD-10-CM | POA: Diagnosis not present

## 2018-12-26 DIAGNOSIS — H81399 Other peripheral vertigo, unspecified ear: Secondary | ICD-10-CM | POA: Diagnosis not present

## 2018-12-26 DIAGNOSIS — G450 Vertebro-basilar artery syndrome: Secondary | ICD-10-CM

## 2018-12-26 DIAGNOSIS — W19XXXA Unspecified fall, initial encounter: Secondary | ICD-10-CM | POA: Diagnosis not present

## 2018-12-26 MED ORDER — DIAZEPAM 5 MG PO TABS: ORAL_TABLET | ORAL | 0 refills | Status: DC

## 2018-12-26 NOTE — Telephone Encounter (Signed)
Called patient as the MRI of head/neck was changed to CT head/neck per Dr. Tomi Likens. Therefore the valium premed was discontinued as patient not going into MRI machine. Call placed to patient and let her know above. Verbalized understanding.

## 2018-12-26 NOTE — Patient Instructions (Addendum)
We will check an MRI of the brain and MRA of head and neck Further recommendations pending results. For claustrophobia, I will prescribe you diazepam 5mg .  Take 30 minutes prior to MRI.  You may take another 5mg  just prior to MRI if needed.  You must have a driver to and from the MRI.

## 2018-12-26 NOTE — Addendum Note (Signed)
Addended by: Jesse Fall on: 12/26/2018 02:38 PM   Modules accepted: Orders

## 2019-01-07 ENCOUNTER — Other Ambulatory Visit: Payer: Self-pay

## 2019-01-07 ENCOUNTER — Ambulatory Visit
Admission: RE | Admit: 2019-01-07 | Discharge: 2019-01-07 | Disposition: A | Discharge status: Home / Self Care | Payer: Medicare Other | Source: Ambulatory Visit | Attending: Neurology | Admitting: Neurology

## 2019-01-07 DIAGNOSIS — Z8673 Personal history of transient ischemic attack (TIA), and cerebral infarction without residual deficits: Secondary | ICD-10-CM

## 2019-01-07 DIAGNOSIS — W19XXXA Unspecified fall, initial encounter: Secondary | ICD-10-CM

## 2019-01-07 DIAGNOSIS — R42 Dizziness and giddiness: Secondary | ICD-10-CM | POA: Diagnosis not present

## 2019-01-07 DIAGNOSIS — G450 Vertebro-basilar artery syndrome: Secondary | ICD-10-CM

## 2019-01-08 ENCOUNTER — Telehealth: Payer: Self-pay

## 2019-01-08 ENCOUNTER — Encounter: Payer: Self-pay | Admitting: Radiology

## 2019-01-08 ENCOUNTER — Ambulatory Visit
Admission: RE | Admit: 2019-01-08 | Discharge: 2019-01-08 | Disposition: A | Discharge status: Home / Self Care | Payer: Medicare Other | Source: Ambulatory Visit | Attending: Neurology | Admitting: Neurology

## 2019-01-08 DIAGNOSIS — I6522 Occlusion and stenosis of left carotid artery: Secondary | ICD-10-CM | POA: Diagnosis not present

## 2019-01-08 DIAGNOSIS — R42 Dizziness and giddiness: Secondary | ICD-10-CM

## 2019-01-08 DIAGNOSIS — G450 Vertebro-basilar artery syndrome: Secondary | ICD-10-CM

## 2019-01-08 DIAGNOSIS — W19XXXA Unspecified fall, initial encounter: Secondary | ICD-10-CM

## 2019-01-08 DIAGNOSIS — Z8673 Personal history of transient ischemic attack (TIA), and cerebral infarction without residual deficits: Secondary | ICD-10-CM

## 2019-01-08 MED ORDER — IOPAMIDOL (ISOVUE-370) INJECTION 76%
75.0000 mL | Freq: Once | INTRAVENOUS | Status: AC | PRN
  Administered 2019-01-08: 75 mL via INTRAVENOUS

## 2019-01-08 NOTE — Telephone Encounter (Signed)
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 With irregular central hypocontiuation recommended thryoid ultrasound.FYI report to come over

## 2019-01-10 ENCOUNTER — Telehealth: Payer: Self-pay

## 2019-01-10 DIAGNOSIS — R42 Dizziness and giddiness: Secondary | ICD-10-CM

## 2019-01-10 DIAGNOSIS — Z8673 Personal history of transient ischemic attack (TIA), and cerebral infarction without residual deficits: Secondary | ICD-10-CM

## 2019-01-10 NOTE — Telephone Encounter (Signed)
-----   Message from Pieter Partridge, DO sent at 01/09/2019  2:43 PM EDT ----- Reviewed MRI of brain and CTA of head and neck.  No findings to explain the dizziness.  A couple of incidental findings noted.  There is a mass in one of the muscles on her head.  It could be a hematoma from one of her falls.  However, I would like to repeat MRI of brain with and without contrast in 3 months to follow up.  There also appears to be a small mass involving her thyroid.  This will need further evaluation by her PCP.  I would like to send the CTA results to her PCP, Dr. Monico Blitz

## 2019-01-10 NOTE — Telephone Encounter (Signed)
Pt informed of results. She was reassured that the growth in brain could be just a collection of blood from her frequent falls and that we would reassess it in 36m at [facility]. With the mass involving the thyroid informed pt that a lot of the time these growths are benign but it needs to be evaluated further.   Order for repeat MRI of brain w/wo placed. Pt knows to follow up with Dr. Manuella Ghazi in regards to Thyroid.

## 2019-01-10 NOTE — Telephone Encounter (Signed)
Pt informed of results.  Reassured that the mass in brain may be a hematoma from her frequent falls. Informed pt that a lot of the time masses are benign involving the thyroid but that it need to be re evaluated by Dr. Manuella Ghazi.  CT scans sent to Dr. Manuella Ghazi

## 2019-01-11 DIAGNOSIS — Z6841 Body Mass Index (BMI) 40.0 and over, adult: Secondary | ICD-10-CM | POA: Diagnosis not present

## 2019-01-11 DIAGNOSIS — I10 Essential (primary) hypertension: Secondary | ICD-10-CM | POA: Diagnosis not present

## 2019-01-11 DIAGNOSIS — Z299 Encounter for prophylactic measures, unspecified: Secondary | ICD-10-CM | POA: Diagnosis not present

## 2019-01-11 DIAGNOSIS — E1159 Type 2 diabetes mellitus with other circulatory complications: Secondary | ICD-10-CM | POA: Diagnosis not present

## 2019-01-11 DIAGNOSIS — E1165 Type 2 diabetes mellitus with hyperglycemia: Secondary | ICD-10-CM | POA: Diagnosis not present

## 2019-02-07 ENCOUNTER — Other Ambulatory Visit: Payer: Self-pay

## 2019-02-07 ENCOUNTER — Ambulatory Visit
Admission: RE | Admit: 2019-02-07 | Discharge: 2019-02-07 | Disposition: A | Discharge status: Home / Self Care | Payer: Medicare Other | Source: Ambulatory Visit | Attending: Neurology | Admitting: Neurology

## 2019-02-07 DIAGNOSIS — R42 Dizziness and giddiness: Secondary | ICD-10-CM | POA: Diagnosis not present

## 2019-02-07 DIAGNOSIS — Z8673 Personal history of transient ischemic attack (TIA), and cerebral infarction without residual deficits: Secondary | ICD-10-CM

## 2019-02-07 DIAGNOSIS — M6289 Other specified disorders of muscle: Secondary | ICD-10-CM | POA: Diagnosis not present

## 2019-02-07 MED ORDER — GADOBENATE DIMEGLUMINE 529 MG/ML IV SOLN
20.0000 mL | Freq: Once | INTRAVENOUS | Status: AC | PRN
  Administered 2019-02-07: 15:00:00 20 mL via INTRAVENOUS

## 2019-02-15 ENCOUNTER — Telehealth: Payer: Self-pay | Admitting: Neurology

## 2019-02-15 NOTE — Telephone Encounter (Signed)
Patient is calling in about the MRI results. Thanks!

## 2019-02-15 NOTE — Telephone Encounter (Signed)
Result Notes for MR BRAIN W WO CONTRAST  Notes recorded by Ranae Plumber, CMA on 02/13/2019 at 8:30 AM EDT  Called patient no answer left message to call office back.  ------   Notes recorded by Pieter Partridge, DO on 02/09/2019 at 8:00 AM EDT  MRI of brain looks unremarkable. There is an incidental finding. There is a mass in the temple muscle. 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 It is not the cause of his symptoms.  However, I would recommend repeating the MRI in one year to follow up on it. She may make a follow up appointment with me to discuss further (it may be virtual/telephone).

## 2019-02-15 NOTE — Telephone Encounter (Signed)
Spoke with patient she was informed of results and understands Sent to front desk to make follow up to discuss further.

## 2019-02-16 ENCOUNTER — Other Ambulatory Visit: Payer: Self-pay

## 2019-02-16 ENCOUNTER — Encounter: Payer: Self-pay | Admitting: Neurology

## 2019-02-16 ENCOUNTER — Telehealth (INDEPENDENT_AMBULATORY_CARE_PROVIDER_SITE_OTHER): Payer: Medicare Other | Admitting: Neurology

## 2019-02-16 VITALS — Ht 64.0 in | Wt 249.0 lb

## 2019-02-16 DIAGNOSIS — R519 Headache, unspecified: Secondary | ICD-10-CM

## 2019-02-16 DIAGNOSIS — R42 Dizziness and giddiness: Secondary | ICD-10-CM | POA: Diagnosis not present

## 2019-02-16 MED ORDER — ZONISAMIDE 25 MG PO CAPS: ORAL_CAPSULE | ORAL | 0 refills | Status: DC
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
## 2019-02-23 DIAGNOSIS — D219 Benign neoplasm of connective and other soft tissue, unspecified: Secondary | ICD-10-CM | POA: Diagnosis not present

## 2019-02-23 DIAGNOSIS — E079 Disorder of thyroid, unspecified: Secondary | ICD-10-CM | POA: Diagnosis not present

## 2019-02-23 DIAGNOSIS — I10 Essential (primary) hypertension: Secondary | ICD-10-CM | POA: Diagnosis not present

## 2019-02-23 DIAGNOSIS — R519 Headache, unspecified: Secondary | ICD-10-CM | POA: Diagnosis not present

## 2019-02-23 DIAGNOSIS — Z6841 Body Mass Index (BMI) 40.0 and over, adult: Secondary | ICD-10-CM | POA: Diagnosis not present

## 2019-02-23 DIAGNOSIS — Z299 Encounter for prophylactic measures, unspecified: Secondary | ICD-10-CM | POA: Diagnosis not present

## 2019-02-23 DIAGNOSIS — E1165 Type 2 diabetes mellitus with hyperglycemia: Secondary | ICD-10-CM | POA: Diagnosis not present

## 2019-03-29 ENCOUNTER — Ambulatory Visit (INDEPENDENT_AMBULATORY_CARE_PROVIDER_SITE_OTHER): Payer: Medicare Other | Admitting: Otolaryngology

## 2019-03-29 ENCOUNTER — Other Ambulatory Visit: Payer: Self-pay

## 2019-03-30 ENCOUNTER — Other Ambulatory Visit (HOSPITAL_COMMUNITY): Payer: Self-pay | Admitting: Otolaryngology

## 2019-03-30 DIAGNOSIS — R221 Localized swelling, mass and lump, neck: Secondary | ICD-10-CM

## 2019-03-30 DIAGNOSIS — E079 Disorder of thyroid, unspecified: Secondary | ICD-10-CM

## 2019-04-03 ENCOUNTER — Other Ambulatory Visit (HOSPITAL_COMMUNITY): Payer: Self-pay | Admitting: Otolaryngology

## 2019-04-03 DIAGNOSIS — R221 Localized swelling, mass and lump, neck: Secondary | ICD-10-CM

## 2019-04-03 DIAGNOSIS — E079 Disorder of thyroid, unspecified: Secondary | ICD-10-CM

## 2019-04-04 ENCOUNTER — Other Ambulatory Visit (HOSPITAL_COMMUNITY): Payer: Self-pay | Admitting: Otolaryngology

## 2019-04-04 DIAGNOSIS — E079 Disorder of thyroid, unspecified: Secondary | ICD-10-CM

## 2019-04-04 DIAGNOSIS — R221 Localized swelling, mass and lump, neck: Secondary | ICD-10-CM

## 2019-04-06 ENCOUNTER — Other Ambulatory Visit: Payer: Self-pay | Admitting: Neurology

## 2019-04-06 MED ORDER — ZONISAMIDE 25 MG PO CAPS: ORAL_CAPSULE | ORAL | 3 refills | Status: DC

## 2019-04-06 NOTE — Telephone Encounter (Signed)
Patient left vm that she was instructed to call back and give update on new medication. She said it is working well and that she was wanting to speak with nurse. Thanks!

## 2019-04-06 NOTE — Telephone Encounter (Signed)
Zonisamide refilled.  Patient informed.

## 2019-04-06 NOTE — Telephone Encounter (Signed)
The following message was left with [facility] on 04/06/19 at 12:13 PM.  Caller states that she is on a medication called Zonisamide 25 MG for dizziness, falling, and feeling like she is going to pass out. She reported the medication is working but she only has two days worth of medication left.  [place] [facility]

## 2019-04-09 ENCOUNTER — Ambulatory Visit (HOSPITAL_COMMUNITY)
Admission: RE | Admit: 2019-04-09 | Discharge: 2019-04-09 | Disposition: A | Discharge status: Home / Self Care | Payer: Medicare Other | Source: Ambulatory Visit | Attending: Otolaryngology | Admitting: Otolaryngology

## 2019-04-09 ENCOUNTER — Other Ambulatory Visit: Payer: Self-pay

## 2019-04-09 DIAGNOSIS — R221 Localized swelling, mass and lump, neck: Secondary | ICD-10-CM | POA: Insufficient documentation

## 2019-04-09 DIAGNOSIS — E079 Disorder of thyroid, unspecified: Secondary | ICD-10-CM | POA: Diagnosis not present

## 2019-04-11 ENCOUNTER — Other Ambulatory Visit: Payer: Self-pay | Admitting: Otolaryngology

## 2019-04-11 DIAGNOSIS — R221 Localized swelling, mass and lump, neck: Secondary | ICD-10-CM

## 2019-04-11 DIAGNOSIS — E079 Disorder of thyroid, unspecified: Secondary | ICD-10-CM

## 2019-05-03 ENCOUNTER — Ambulatory Visit
Admission: RE | Admit: 2019-05-03 | Discharge: 2019-05-03 | Disposition: A | Discharge status: Home / Self Care | Payer: Medicare Other | Source: Ambulatory Visit | Attending: Otolaryngology | Admitting: Otolaryngology

## 2019-05-03 ENCOUNTER — Other Ambulatory Visit (HOSPITAL_COMMUNITY)
Admission: RE | Admit: 2019-05-03 | Discharge: 2019-05-03 | Disposition: A | Discharge status: Home / Self Care | Payer: Medicare Other | Source: Ambulatory Visit | Attending: Otolaryngology | Admitting: Otolaryngology

## 2019-05-03 DIAGNOSIS — R221 Localized swelling, mass and lump, neck: Secondary | ICD-10-CM | POA: Diagnosis not present

## 2019-05-03 DIAGNOSIS — E042 Nontoxic multinodular goiter: Secondary | ICD-10-CM | POA: Diagnosis not present

## 2019-05-03 DIAGNOSIS — E079 Disorder of thyroid, unspecified: Secondary | ICD-10-CM

## 2019-05-03 DIAGNOSIS — R896 Abnormal cytological findings in specimens from other organs, systems and tissues: Secondary | ICD-10-CM | POA: Diagnosis not present

## 2019-05-03 DIAGNOSIS — D440 Neoplasm of uncertain behavior of thyroid gland: Secondary | ICD-10-CM | POA: Diagnosis not present

## 2019-05-07 LAB — CYTOLOGY - NON PAP

## 2019-05-18 ENCOUNTER — Encounter (HOSPITAL_COMMUNITY): Payer: Self-pay

## 2019-06-28 ENCOUNTER — Telehealth: Payer: Medicare Other | Admitting: Neurology

## 2019-06-28 DIAGNOSIS — Z23 Encounter for immunization: Secondary | ICD-10-CM | POA: Diagnosis not present

## 2019-08-03 DIAGNOSIS — Z23 Encounter for immunization: Secondary | ICD-10-CM | POA: Diagnosis not present

## 2019-08-14 DIAGNOSIS — Z7189 Other specified counseling: Secondary | ICD-10-CM | POA: Diagnosis not present

## 2019-08-14 DIAGNOSIS — Z1331 Encounter for screening for depression: Secondary | ICD-10-CM | POA: Diagnosis not present

## 2019-08-14 DIAGNOSIS — Z1211 Encounter for screening for malignant neoplasm of colon: Secondary | ICD-10-CM | POA: Diagnosis not present

## 2019-08-14 DIAGNOSIS — R5383 Other fatigue: Secondary | ICD-10-CM | POA: Diagnosis not present

## 2019-08-14 DIAGNOSIS — Z299 Encounter for prophylactic measures, unspecified: Secondary | ICD-10-CM | POA: Diagnosis not present

## 2019-08-14 DIAGNOSIS — Z0000 Encounter for general adult medical examination without abnormal findings: Secondary | ICD-10-CM | POA: Diagnosis not present

## 2019-08-14 DIAGNOSIS — E1165 Type 2 diabetes mellitus with hyperglycemia: Secondary | ICD-10-CM | POA: Diagnosis not present

## 2019-08-14 DIAGNOSIS — Z6841 Body Mass Index (BMI) 40.0 and over, adult: Secondary | ICD-10-CM | POA: Diagnosis not present

## 2019-08-14 DIAGNOSIS — Z1339 Encounter for screening examination for other mental health and behavioral disorders: Secondary | ICD-10-CM | POA: Diagnosis not present

## 2019-08-15 DIAGNOSIS — Z79899 Other long term (current) drug therapy: Secondary | ICD-10-CM | POA: Diagnosis not present

## 2019-08-15 DIAGNOSIS — E7800 Pure hypercholesterolemia, unspecified: Secondary | ICD-10-CM | POA: Diagnosis not present

## 2019-08-15 DIAGNOSIS — E559 Vitamin D deficiency, unspecified: Secondary | ICD-10-CM | POA: Diagnosis not present

## 2019-08-15 DIAGNOSIS — R5383 Other fatigue: Secondary | ICD-10-CM | POA: Diagnosis not present

## 2019-08-16 ENCOUNTER — Encounter: Payer: Self-pay | Admitting: Neurology
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
## 2019-08-17 ENCOUNTER — Encounter: Payer: Self-pay | Admitting: Neurology

## 2019-08-17 ENCOUNTER — Telehealth (INDEPENDENT_AMBULATORY_CARE_PROVIDER_SITE_OTHER): Payer: Medicare Other | Admitting: Neurology

## 2019-08-17 ENCOUNTER — Other Ambulatory Visit: Payer: Self-pay

## 2019-08-17 VITALS — BP 157/78 | HR 86 | Ht 64.0 in | Wt 254.0 lb

## 2019-08-17 DIAGNOSIS — D219 Benign neoplasm of connective and other soft tissue, unspecified: Secondary | ICD-10-CM | POA: Diagnosis not present

## 2019-08-17 DIAGNOSIS — R519 Headache, unspecified: Secondary | ICD-10-CM

## 2019-08-17 DIAGNOSIS — F419 Anxiety disorder, unspecified: Secondary | ICD-10-CM

## 2019-08-17 DIAGNOSIS — R42 Dizziness and giddiness: Secondary | ICD-10-CM | POA: Diagnosis not present

## 2019-08-17 MED ORDER — ZONISAMIDE 25 MG PO CAPS: 75.0000 mg | ORAL_CAPSULE | Freq: Every day | ORAL | 5 refills | Status: DC

## 2019-08-17 NOTE — Patient Instructions (Addendum)
1. Increase zonisamide to 3 pills at bedtime.  We can increase dose in 2 months if needed 2.  Limit use of pain relievers such as Tylenol and ibuprofen to no more than 2 days out of week to prevent risk of rebound or medication-overuse headache. 3.  Follow up with Dr. Manuella Ghazi regarding anxiety 4.  Repeat MRI of brain with and without contrast in 6 months. We have sent a referral to [facility] for your MRI and they will call you directly to schedule your appointment. They are located at [place]. If you need to contact them directly please call [phone].  5.  Follow up after repeat MRI

## 2019-09-04 DIAGNOSIS — L910 Hypertrophic scar: Secondary | ICD-10-CM | POA: Diagnosis not present

## 2019-11-22 DIAGNOSIS — Z299 Encounter for prophylactic measures, unspecified: Secondary | ICD-10-CM | POA: Diagnosis not present

## 2019-11-22 DIAGNOSIS — N390 Urinary tract infection, site not specified: Secondary | ICD-10-CM | POA: Diagnosis not present

## 2019-11-22 DIAGNOSIS — G4730 Sleep apnea, unspecified: Secondary | ICD-10-CM | POA: Diagnosis not present

## 2019-11-22 DIAGNOSIS — E1165 Type 2 diabetes mellitus with hyperglycemia: Secondary | ICD-10-CM | POA: Diagnosis not present

## 2019-11-22 DIAGNOSIS — I10 Essential (primary) hypertension: Secondary | ICD-10-CM | POA: Diagnosis not present

## 2019-11-22 DIAGNOSIS — R350 Frequency of micturition: Secondary | ICD-10-CM | POA: Diagnosis not present

## 2019-11-22 DIAGNOSIS — Z87891 Personal history of nicotine dependence: Secondary | ICD-10-CM | POA: Diagnosis not present

## 2019-11-28 DIAGNOSIS — E1165 Type 2 diabetes mellitus with hyperglycemia: Secondary | ICD-10-CM | POA: Diagnosis not present

## 2019-11-28 DIAGNOSIS — E119 Type 2 diabetes mellitus without complications: Secondary | ICD-10-CM | POA: Diagnosis not present

## 2019-11-28 DIAGNOSIS — Z299 Encounter for prophylactic measures, unspecified: Secondary | ICD-10-CM | POA: Diagnosis not present

## 2019-11-28 DIAGNOSIS — I10 Essential (primary) hypertension: Secondary | ICD-10-CM | POA: Diagnosis not present

## 2019-11-28 DIAGNOSIS — M1710 Unilateral primary osteoarthritis, unspecified knee: Secondary | ICD-10-CM | POA: Diagnosis not present

## 2019-11-28 DIAGNOSIS — M1711 Unilateral primary osteoarthritis, right knee: Secondary | ICD-10-CM | POA: Diagnosis not present

## 2019-11-28 DIAGNOSIS — G4730 Sleep apnea, unspecified: Secondary | ICD-10-CM | POA: Diagnosis not present

## 2019-11-28 DIAGNOSIS — Z6841 Body Mass Index (BMI) 40.0 and over, adult: Secondary | ICD-10-CM | POA: Diagnosis not present

## 2019-12-06 DIAGNOSIS — N898 Other specified noninflammatory disorders of vagina: Secondary | ICD-10-CM | POA: Diagnosis not present

## 2019-12-06 DIAGNOSIS — N94819 Vulvodynia, unspecified: Secondary | ICD-10-CM | POA: Diagnosis not present

## 2019-12-06 DIAGNOSIS — N763 Subacute and chronic vulvitis: Secondary | ICD-10-CM | POA: Diagnosis not present

## 2019-12-21 DIAGNOSIS — I152 Hypertension secondary to endocrine disorders: Secondary | ICD-10-CM | POA: Diagnosis not present

## 2019-12-21 DIAGNOSIS — S81851A Open bite, right lower leg, initial encounter: Secondary | ICD-10-CM | POA: Diagnosis not present

## 2019-12-21 DIAGNOSIS — E1165 Type 2 diabetes mellitus with hyperglycemia: Secondary | ICD-10-CM | POA: Diagnosis not present

## 2019-12-21 DIAGNOSIS — E1159 Type 2 diabetes mellitus with other circulatory complications: Secondary | ICD-10-CM | POA: Diagnosis not present

## 2019-12-21 DIAGNOSIS — Z299 Encounter for prophylactic measures, unspecified: Secondary | ICD-10-CM | POA: Diagnosis not present

## 2019-12-28 IMAGING — MR MR HEAD WO/W CM
12 series · 48 of 48 positions shown · IV contrast (20ml Multihance)
Comparison: 01/07/2019

CLINICAL DATA: Dizziness.  Abnormal brain MRI

EXAM:
MRI HEAD WITHOUT AND WITH CONTRAST
TECHNIQUE: Multiplanar, multiecho pulse sequences of the brain and surrounding
structures were obtained without and with intravenous contrast.
CONTRAST:  20mL MULTIHANCE GADOBENATE DIMEGLUMINE 529 MG/ML IV SOLN

[Series 2: t1_se_sag · sagittal · 5.0mm · 0.45mm/px · 2 of 24 slices shown]
[im 1/24]
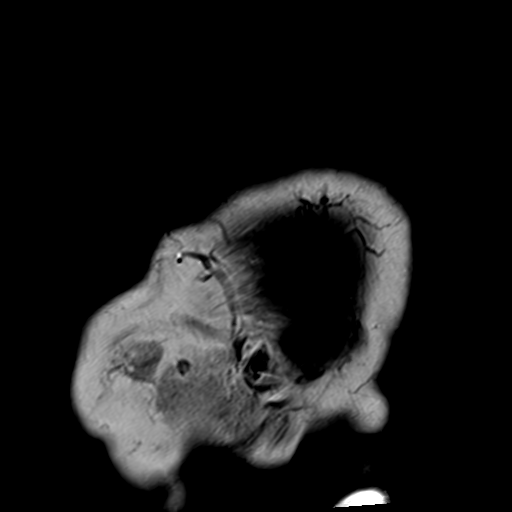
[im 24/24]
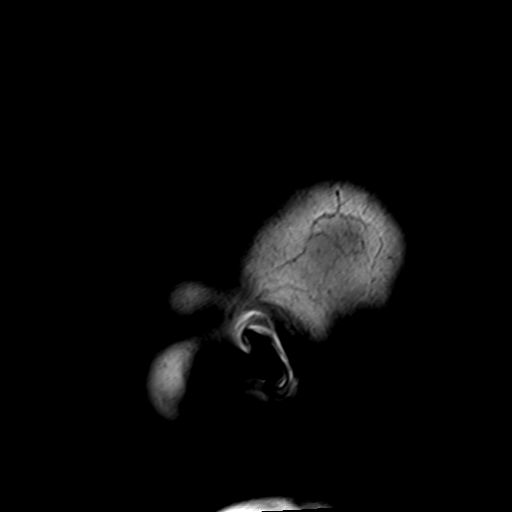

[Series 3: ep2d_diff_3 · axial · 3.0mm · 1.80mm/px · z∈[-50,+95]mm · 6 of 101 slices shown]
[im 1/101]
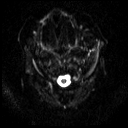
[im 21/101]
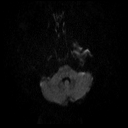
[im 41/101]
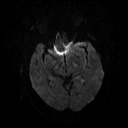
[im 61/101]
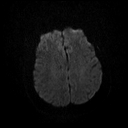
[im 81/101]
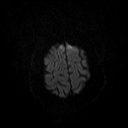
[im 101/101]
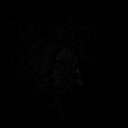

[Series 4: ep2d_diff_3_adc · axial · 3.0mm · 1.80mm/px · z∈[-50,+95]mm · 3 of 52 slices shown]
[im 1/52]
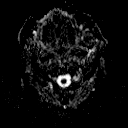
[im 26/52]
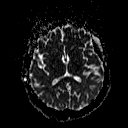
[im 52/52]
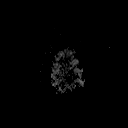

[Series 5: ep2d_diff_cor · coronal · 5.0mm · 1.77mm/px · 3 of 54 slices shown]
[im 1/54]
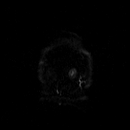
[im 27/54]
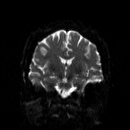
[im 54/54]
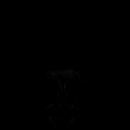

[Series 6: ep2d_diff_cor_adc · coronal · 5.0mm · 1.77mm/px · 2 of 27 slices shown]
[im 1/27]
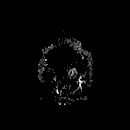
[im 27/27]
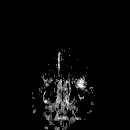

[Series 7: FLAIR · axial · 3.0mm · 0.45mm/px · z∈[-54,+100]mm · 2 of 28 slices shown]
[im 1/28]
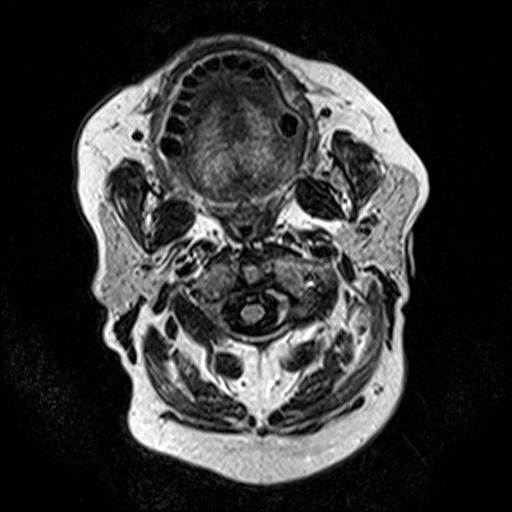
[im 28/28]
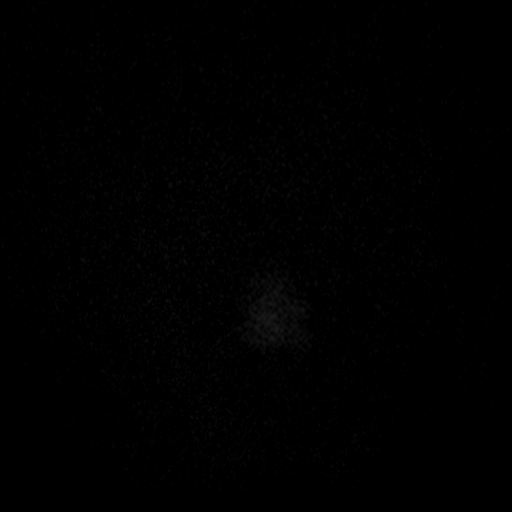

[Series 8: t2_tse_tra · axial · 5.0mm · 0.60mm/px · z∈[-54,+100]mm · 2 of 28 slices shown]
[im 1/28]
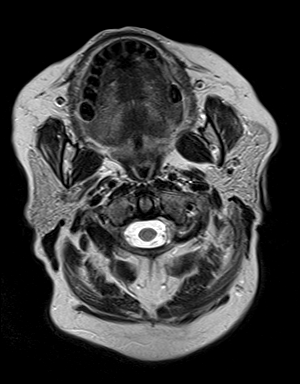
[im 28/28]
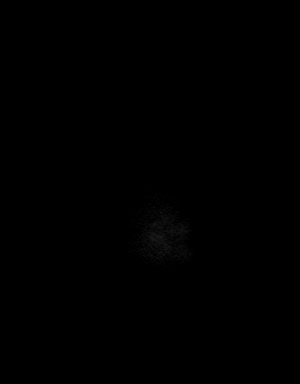

[Series 10: swi_images · axial · 2.0mm · 0.90mm/px · z∈[-60,+105]mm · 6 of 88 slices shown]
[im 1/88]
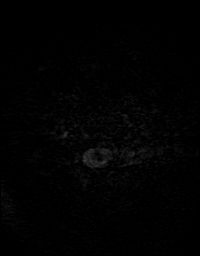
[im 18/88]
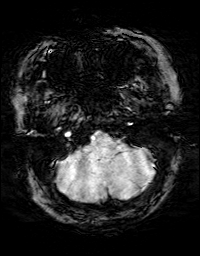
[im 35/88]
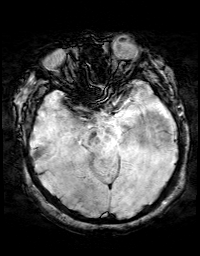
[im 53/88]
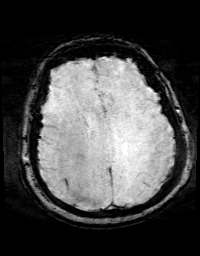
[im 70/88]
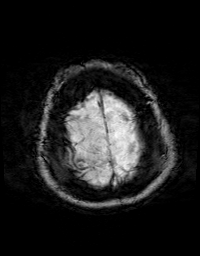
[im 88/88]
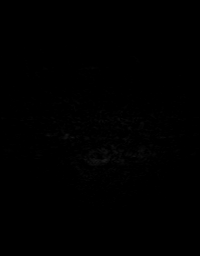

[Series 11: t1_mpr_tra · axial · 1.1mm · 0.72mm/px · z∈[-52,+97]mm · 9 of 144 slices shown]
[im 1/144]
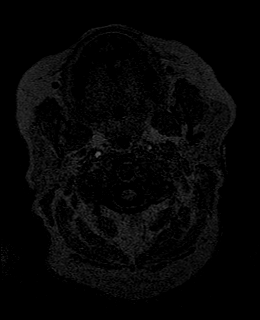
[im 18/144]
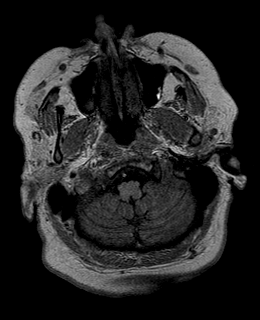
[im 36/144]
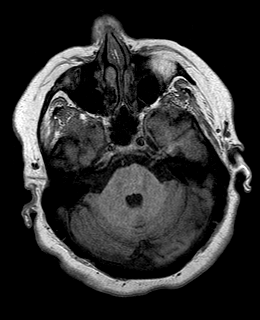
[im 54/144]
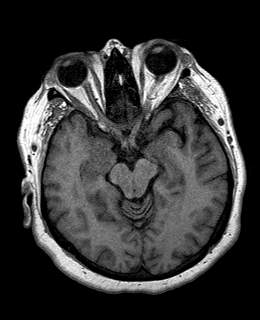
[im 72/144]
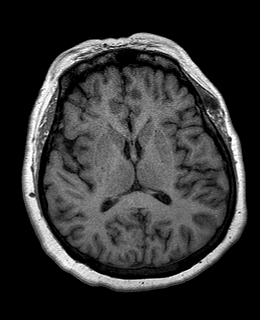
[im 90/144]
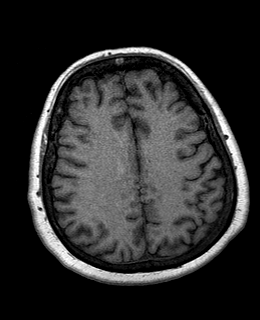
[im 108/144]
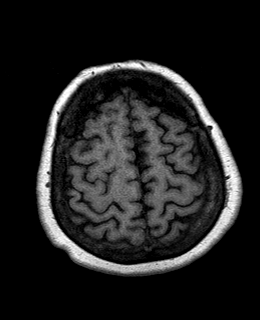
[im 126/144]
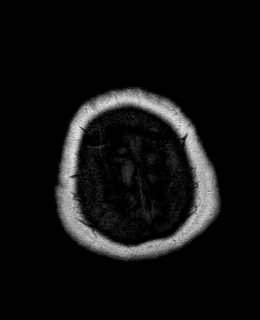
[im 144/144]
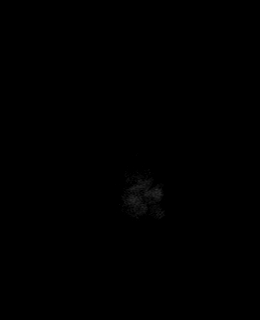

[Series 12: T2 post-contrast · coronal · 5.0mm · 0.45mm/px · 2 of 30 slices shown]
[im 1/30]
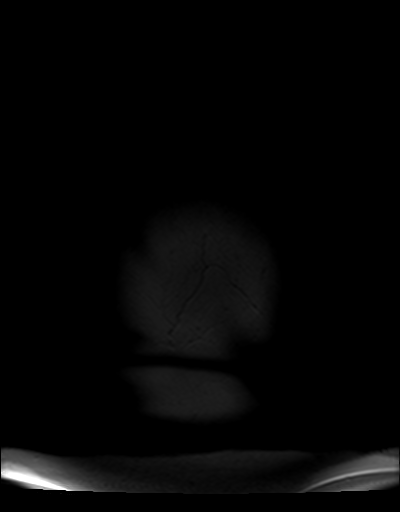
[im 30/30]
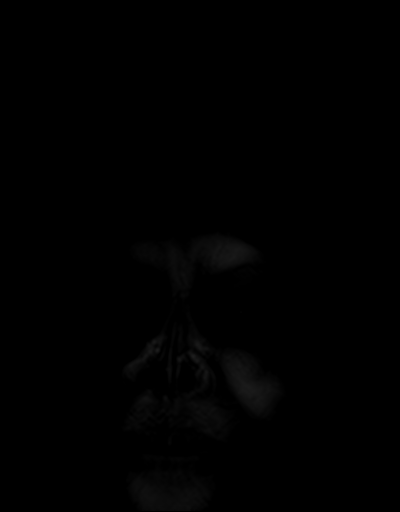

[Series 13: post t1_mpr_tra · axial · 1.1mm · 0.72mm/px · z∈[-52,+97]mm · 9 of 144 slices shown]
[im 1/144]
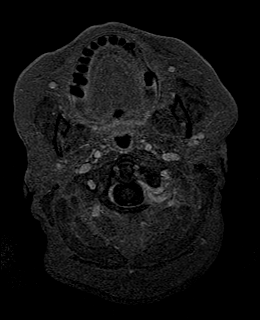
[im 18/144]
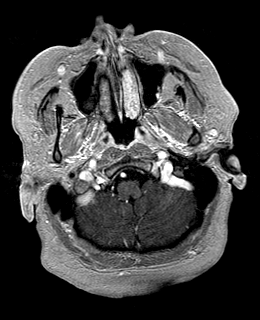
[im 36/144]
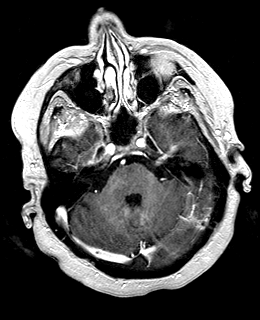
[im 54/144]
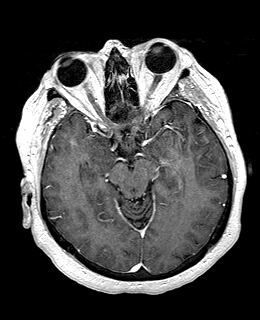
[im 72/144]
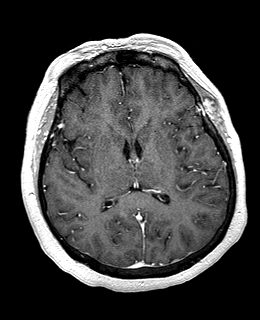
[im 90/144]
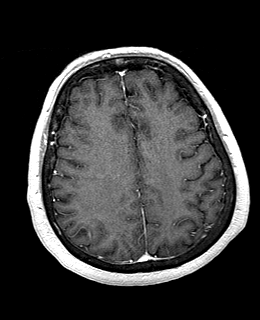
[im 108/144]
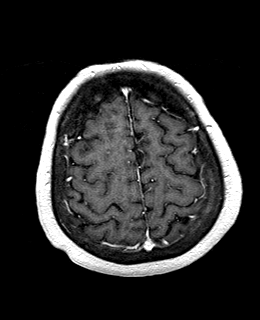
[im 126/144]
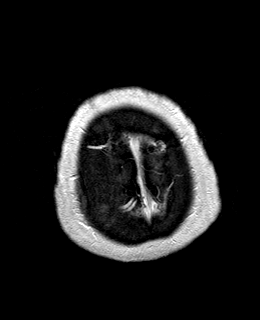
[im 144/144]
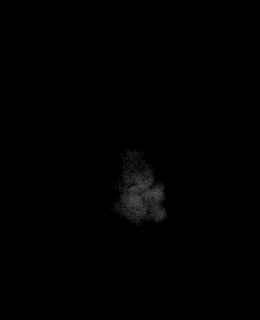

[Series 14: T1 post-contrast · coronal · 5.0mm · 0.72mm/px · 2 of 30 slices shown]
[im 1/30]
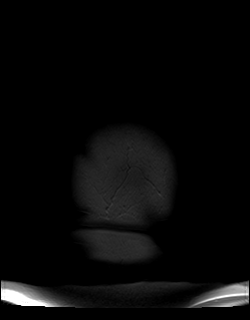
[im 30/30]
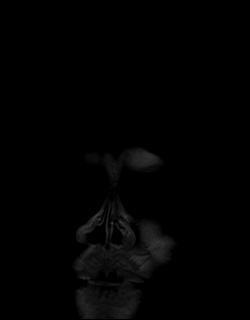

[48 of 48 positions shown; findings below may reference images not displayed]

FINDINGS: Brain: No acute infarction, hemorrhage, hydrocephalus, extra-axial
collection or mass lesion. Rare remote white matter insults,
commonly seen by the seventh decade. Normal brain volume.

Vascular: Normal flow voids and vessel enhancement. This includes
the vertebrobasilar circulation.

Skull and upper cervical spine: Negative for marrow lesion

Sinuses/Orbits: Negative

Other: Re- demonstrated T2 hyperintense mass in the left temporalis
muscle which is solid given generalized internal enhancement.
Dimensions measure up to 12 mm. The mass has stable dimensions since
a head CT in 7201.
IMPRESSION: 1. 12 mm left temporalis muscle mass is enhancing and solid. The
mass has bright T2 signal and is low-density by CT (03/07/2017).
This is most likely a neoplasm with signal characteristics favoring
a myxoma or nerve sheath tumor. Stability at 2 years is compatible
with benign process.
2. No explanation for dizziness.

## 2020-01-23 ENCOUNTER — Other Ambulatory Visit: Payer: Medicare Other

## 2020-02-14 DIAGNOSIS — I10 Essential (primary) hypertension: Secondary | ICD-10-CM | POA: Diagnosis not present

## 2020-02-14 DIAGNOSIS — Z23 Encounter for immunization: Secondary | ICD-10-CM | POA: Diagnosis not present

## 2020-02-14 DIAGNOSIS — E1165 Type 2 diabetes mellitus with hyperglycemia: Secondary | ICD-10-CM | POA: Diagnosis not present

## 2020-02-14 DIAGNOSIS — Z299 Encounter for prophylactic measures, unspecified: Secondary | ICD-10-CM | POA: Diagnosis not present

## 2020-02-14 DIAGNOSIS — Z6841 Body Mass Index (BMI) 40.0 and over, adult: Secondary | ICD-10-CM | POA: Diagnosis not present

## 2020-02-14 DIAGNOSIS — R609 Edema, unspecified: Secondary | ICD-10-CM | POA: Diagnosis not present

## 2020-02-18 ENCOUNTER — Ambulatory Visit: Payer: Medicare Other | Admitting: Neurology

## 2020-02-20 ENCOUNTER — Other Ambulatory Visit: Payer: Medicare Other

## 2020-02-25 DIAGNOSIS — R600 Localized edema: Secondary | ICD-10-CM | POA: Diagnosis not present

## 2020-02-29 DIAGNOSIS — M25561 Pain in right knee: Secondary | ICD-10-CM | POA: Diagnosis not present

## 2020-03-10 DIAGNOSIS — E785 Hyperlipidemia, unspecified: Secondary | ICD-10-CM | POA: Diagnosis not present

## 2020-03-10 DIAGNOSIS — G4730 Sleep apnea, unspecified: Secondary | ICD-10-CM | POA: Diagnosis not present

## 2020-03-10 DIAGNOSIS — E1143 Type 2 diabetes mellitus with diabetic autonomic (poly)neuropathy: Secondary | ICD-10-CM | POA: Diagnosis not present

## 2020-03-10 DIAGNOSIS — E0843 Diabetes mellitus due to underlying condition with diabetic autonomic (poly)neuropathy: Secondary | ICD-10-CM | POA: Diagnosis not present

## 2020-03-10 DIAGNOSIS — M7050 Other bursitis of knee, unspecified knee: Secondary | ICD-10-CM | POA: Diagnosis not present

## 2020-03-10 DIAGNOSIS — I10 Essential (primary) hypertension: Secondary | ICD-10-CM | POA: Diagnosis not present

## 2020-03-13 ENCOUNTER — Ambulatory Visit
Admission: RE | Admit: 2020-03-13 | Discharge: 2020-03-13 | Disposition: A | Discharge status: Home / Self Care | Payer: Medicare Other | Source: Ambulatory Visit | Attending: Neurology | Admitting: Neurology

## 2020-03-13 DIAGNOSIS — R42 Dizziness and giddiness: Secondary | ICD-10-CM

## 2020-03-13 DIAGNOSIS — J341 Cyst and mucocele of nose and nasal sinus: Secondary | ICD-10-CM | POA: Diagnosis not present

## 2020-03-13 DIAGNOSIS — I6782 Cerebral ischemia: Secondary | ICD-10-CM | POA: Diagnosis not present

## 2020-03-13 DIAGNOSIS — J322 Chronic ethmoidal sinusitis: Secondary | ICD-10-CM | POA: Diagnosis not present

## 2020-03-13 MED ORDER — GADOBENATE DIMEGLUMINE 529 MG/ML IV SOLN
20.0000 mL | Freq: Once | INTRAVENOUS | Status: AC | PRN
  Administered 2020-03-13: 20 mL via INTRAVENOUS

## 2020-03-17 NOTE — Progress Notes (Signed)
2 attempt at call pt no answer. LMOVM to call back

## 2020-04-10 DIAGNOSIS — Z23 Encounter for immunization: Secondary | ICD-10-CM | POA: Diagnosis not present

## 2020-06-04 ENCOUNTER — Telehealth: Payer: Self-pay | Admitting: Neurology

## 2020-06-04 NOTE — Telephone Encounter (Signed)
Pt advised of her MRI Results.   Pt just wanted Dr.Jaffe to know she stop the Zonisamide due to the medication causing headaches. Pt will call us if she feels she needs something.

## 2020-06-12 DIAGNOSIS — I10 Essential (primary) hypertension: Secondary | ICD-10-CM | POA: Diagnosis not present

## 2020-06-12 DIAGNOSIS — G4730 Sleep apnea, unspecified: Secondary | ICD-10-CM | POA: Diagnosis not present

## 2020-06-12 DIAGNOSIS — R519 Headache, unspecified: Secondary | ICD-10-CM | POA: Diagnosis not present

## 2020-06-12 DIAGNOSIS — E1143 Type 2 diabetes mellitus with diabetic autonomic (poly)neuropathy: Secondary | ICD-10-CM | POA: Diagnosis not present

## 2020-06-12 DIAGNOSIS — M7050 Other bursitis of knee, unspecified knee: Secondary | ICD-10-CM | POA: Diagnosis not present

## 2020-06-12 DIAGNOSIS — E785 Hyperlipidemia, unspecified: Secondary | ICD-10-CM | POA: Diagnosis not present

## 2020-07-18 DIAGNOSIS — M629 Disorder of muscle, unspecified: Secondary | ICD-10-CM | POA: Diagnosis not present

## 2020-07-18 DIAGNOSIS — R519 Headache, unspecified: Secondary | ICD-10-CM | POA: Diagnosis not present
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
## 2020-08-04 ENCOUNTER — Ambulatory Visit: Payer: Medicare Other | Admitting: Neurology

## 2020-08-28 DIAGNOSIS — N760 Acute vaginitis: Secondary | ICD-10-CM | POA: Diagnosis not present

## 2020-09-26 ENCOUNTER — Emergency Department (HOSPITAL_COMMUNITY)
Admission: EM | Admit: 2020-09-26 | Discharge: 2020-09-26 | Disposition: A | Discharge status: Home / Self Care | Payer: Medicare Other | Attending: Emergency Medicine | Admitting: Emergency Medicine

## 2020-09-26 ENCOUNTER — Encounter (HOSPITAL_COMMUNITY): Payer: Self-pay | Admitting: *Deleted

## 2020-09-26 ENCOUNTER — Emergency Department (HOSPITAL_COMMUNITY): Payer: Medicare Other

## 2020-09-26 DIAGNOSIS — R0789 Other chest pain: Secondary | ICD-10-CM | POA: Insufficient documentation

## 2020-09-26 DIAGNOSIS — R079 Chest pain, unspecified: Secondary | ICD-10-CM

## 2020-09-26 DIAGNOSIS — I209 Angina pectoris, unspecified: Secondary | ICD-10-CM | POA: Diagnosis not present

## 2020-09-26 DIAGNOSIS — E119 Type 2 diabetes mellitus without complications: Secondary | ICD-10-CM | POA: Diagnosis not present

## 2020-09-26 DIAGNOSIS — Z87891 Personal history of nicotine dependence: Secondary | ICD-10-CM | POA: Diagnosis not present

## 2020-09-26 DIAGNOSIS — I10 Essential (primary) hypertension: Secondary | ICD-10-CM | POA: Insufficient documentation

## 2020-09-26 DIAGNOSIS — Z7982 Long term (current) use of aspirin: Secondary | ICD-10-CM | POA: Diagnosis not present

## 2020-09-26 DIAGNOSIS — J45909 Unspecified asthma, uncomplicated: Secondary | ICD-10-CM | POA: Diagnosis not present

## 2020-09-26 DIAGNOSIS — Z79899 Other long term (current) drug therapy: Secondary | ICD-10-CM | POA: Insufficient documentation

## 2020-09-26 DIAGNOSIS — Z7984 Long term (current) use of oral hypoglycemic drugs: Secondary | ICD-10-CM | POA: Insufficient documentation

## 2020-09-26 LAB — BASIC METABOLIC PANEL
Anion gap: 11 (ref 5–15)
BUN: 17 mg/dL (ref 8–23)
CO2: 25 mmol/L (ref 22–32)
Calcium: 9.3 mg/dL (ref 8.9–10.3)
Chloride: 98 mmol/L (ref 98–111)
Creatinine, Ser: 0.62 mg/dL (ref 0.44–1.00)
GFR, Estimated: >60 mL/min (ref >60)
Glucose, Bld: 152 mg/dL — ABNORMAL HIGH (ref 70–99)
Potassium: 3.7 mmol/L (ref 3.5–5.1)
Sodium: 134 mmol/L — ABNORMAL LOW (ref 135–145)

## 2020-09-26 LAB — CBC
HCT: 42.3 % (ref 36.0–46.0)
Hemoglobin: 14 g/dL (ref 12.0–15.0)
MCH: 30.6 pg (ref 26.0–34.0)
MCHC: 33.1 g/dL (ref 30.0–36.0)
MCV: 92.6 fL (ref 80.0–100.0)
Platelets: 260 10*3/uL (ref 150–400)
RBC: 4.57 MIL/uL (ref 3.87–5.11)
RDW: 13.8 % (ref 11.5–15.5)
WBC: 10.3 10*3/uL (ref 4.0–10.5)
nRBC: 0 % (ref 0.0–0.2)

## 2020-09-26 LAB — TROPONIN I (HIGH SENSITIVITY)
Troponin I (High Sensitivity): 3 ng/L (ref <18)
Troponin I (High Sensitivity): 3 ng/L (ref <18)

## 2020-09-26 MED ORDER — ASPIRIN 81 MG PO CHEW
324.0000 mg | CHEWABLE_TABLET | Freq: Once | ORAL | Status: AC
  Administered 2020-09-26: 324 mg via ORAL
  Filled 2020-09-26: qty 4

## 2020-09-26 NOTE — ED Triage Notes (Signed)
Pain in right breast since last night, also has a heaviness in chest

## 2020-09-26 NOTE — Discharge Instructions (Addendum)
The testing today did not show any cardiac problems that requires staying in the hospital.  You did have a little bit of fluid in your lungs, and have some peripheral edema that will need to be worked on.  Try to avoid salt in your diet.  This will help a lot.  Also, when you see Dr. Freida Busman, ask him to schedule a echocardiogram to evaluate your cardiac function.  He can also help you with scheduling a mammogram as needed.  Continue taking your usual medications.

## 2020-09-26 NOTE — ED Provider Notes (Signed)
Emergency Medicine Provider Triage Evaluation Note  Andrea Rios , a 70 y.o. female  was evaluated in triage.  Pt complains of right-sided chest pain.  She has a history of previous MI and stroke.  The patient states that she has had pain in the right side of her chest rating to the right side of her neck that is intermittent, sharp and nonexertional.  She feels some pressure across her chest and was sent in by her PCP she has had a couple episodes of nausea vomiting and diarrhea over the past 2 weeks.  Review of Systems  Positive: Chest pain Negative: Diaphoresis or vomiting  Physical Exam  BP (!) 152/66 (BP Location: Right Arm)   Pulse 88   Temp 98.6 F (37 C) (Oral)   Resp 20   SpO2 96%  Gen:   Awake, no distress   Resp:  Normal effort  MSK:   Moves extremities without difficulty  Other:  Nervous  Medical Decision Making  Medically screening exam initiated at 3:00 PM.  Appropriate orders placed.  Andrea Rios was informed that the remainder of the evaluation will be completed by another provider, this initial triage assessment does not replace that evaluation, and the importance of remaining in the ED until their evaluation is complete.  Patient sent in for ACS evaluation by PCP.  Labs and imaging ordered.   Andrea Mail, PA-C 09/26/20 1505    Margette Fast, MD 09/29/20 [phone]

## 2020-09-26 NOTE — ED Provider Notes (Signed)
[facility] EMERGENCY DEPARTMENT Provider Note   CSN: [id number] Arrival date & time: 09/26/20  1401     History Chief Complaint  Patient presents with  . Chest Pain    Andrea Rios is a 70 y.o. female.  HPI She presents for evaluation of sharp chest pain which started yesterday, waxing waning.  The pain is in her right breast and persisted today so she went to see her PCP.  He directed her to the ED for further evaluation.  She also complains of a tightness in her chest which started several hours ago and is persistent.  She does not have any associated nausea, vomiting, shortness of breath, weakness or dizziness.  She is taking her usual medicines.  She states she has previously had stroke and heart attack, and cardiac catheterization with a blocked artery.  She denies other concurrent problems.  There are no other known active modifying factors.    Past Medical History:  Diagnosis Date  . Asthma   . Back pain   . Cerebrovascular disease   . Constipation   . Diabetes ([place])   . Fibromyalgia   . H/O colonoscopy 04/12/2014   polp, diverticulosis, Dr. Britta Mccreedy  . H/O mammogram 11/2014   normal  . HTN (hypertension)   . Hyperlipidemia   . Lumbago   . Obesity   . Paronychia of toe   . Snoring   . Stroke [facility]) 2003    Patient Active Problem List   Diagnosis Date Noted  . Precordial pain 10/09/2012    Past Surgical History:  Procedure Laterality Date  . GALLBLADDER SURGERY    . HERNIA REPAIR     x3  . LEFT HEART CATHETERIZATION WITH CORONARY ANGIOGRAM N/A 10/09/2012   Procedure: LEFT HEART CATHETERIZATION WITH CORONARY ANGIOGRAM;  Surgeon: Peter M Martinique, MD;  Location: [facility] CATH LAB;  Service: Cardiovascular;  Laterality: N/A;  . TONSILLECTOMY AND ADENOIDECTOMY       OB History   No obstetric history on file.     Family History  Problem Relation Age of Onset  . Heart failure Mother   . Heart failure Father   . Parkinson's disease Sister   . Lung cancer Sister      Social History   Tobacco Use  . Smoking status: Former Smoker    Packs/day: 2.00    Years: 12.00    Pack years: 24.00    Types: Cigarettes    Start date: 06/19/1965    Quit date: 04/26/1977    Years since quitting: 43.4  . Smokeless tobacco: Never Used  Vaping Use  . Vaping Use: Never used  Substance Use Topics  . Alcohol use: Not Currently    Comment: occasional    Home Medications Prior to Admission medications   Medication Sig Start Date End Date Taking? Authorizing Provider  albuterol (PROVENTIL) (2.5 MG/3ML) 0.083% nebulizer solution Take 2.5 mg by nebulization every 6 (six) hours as needed for wheezing.    [provider]  amLODipine (NORVASC) 10 MG tablet Take 10 mg by mouth daily.    [provider]  aspirin 81 MG chewable tablet Chew 81 mg by mouth daily.    [provider]  Benzyl Alcohol (ULESFIA) 5 % LOTN Apply topically as needed.    [provider]  budesonide-formoterol (SYMBICORT) 160-4.5 MCG/ACT inhaler Inhale 2 puffs into the lungs 2 (two) times daily as needed.     [provider]  gabapentin (NEURONTIN) 300 MG capsule Take 300 mg  by mouth at bedtime as needed.     [provider]  glimepiride (AMARYL) 2 MG tablet Take 2 mg by mouth daily before breakfast.    [provider]  hydrochlorothiazide (HYDRODIURIL) 25 MG tablet Take 1 tablet (25 mg total) by mouth daily. 04/23/16 07/22/16  Herminio Commons, MD  lisinopril (PRINIVIL,ZESTRIL) 40 MG tablet Take 40 mg by mouth daily.    [provider]  metFORMIN (GLUCOPHAGE) 500 MG tablet Take 1 tablet (500 mg total) by mouth 2 (two) times daily with a meal. HOLD for 48 hours, restart on 10/12/2012. 12/04/12   Barrett, Evelene Croon, PA-C  montelukast (SINGULAIR) 10 MG tablet Take 10 mg by mouth daily. 11/27/18   [provider]  rosuvastatin (CRESTOR) 5 MG tablet Take 5 mg by mouth daily. 11/27/18   [provider]  zonisamide (ZONEGRAN)  25 MG capsule Take 3 capsules (75 mg total) by mouth at bedtime. 08/17/19   Pieter Partridge, DO    Allergies    Prednisone, Sulfa antibiotics, and Zocor [simvastatin]  Review of Systems   Review of Systems  All other systems reviewed and are negative.   Physical Exam Updated Vital Signs BP (!) 126/57 (BP Location: Right Wrist)   Pulse 65   Temp 98.6 F (37 C) (Oral)   Resp 13   SpO2 94%   Physical Exam Vitals and nursing note reviewed.  Constitutional:      General: She is not in acute distress.    Appearance: She is well-developed. She is not ill-appearing, toxic-appearing or diaphoretic.  HENT:     Head: Normocephalic and atraumatic.     Right Ear: External ear normal.     Left Ear: External ear normal.  Eyes:     Conjunctiva/sclera: Conjunctivae normal.     Pupils: Pupils are equal, round, and reactive to light.  Neck:     Trachea: Phonation normal.  Cardiovascular:     Rate and Rhythm: Normal rate and regular rhythm.     Heart sounds: Normal heart sounds.  Pulmonary:     Effort: Pulmonary effort is normal.     Breath sounds: Normal breath sounds.  Abdominal:     General: There is no distension.     Palpations: Abdomen is soft.     Tenderness: There is no abdominal tenderness.  Musculoskeletal:        General: Normal range of motion.     Cervical back: Normal range of motion and neck supple.  Skin:    General: Skin is warm and dry.  Neurological:     Mental Status: She is alert and oriented to person, place, and time.     Cranial Nerves: No cranial nerve deficit.     Sensory: No sensory deficit.     Motor: No abnormal muscle tone.     Coordination: Coordination normal.  Psychiatric:        Mood and Affect: Mood normal.        Behavior: Behavior normal.        Thought Content: Thought content normal.        Judgment: Judgment normal.     ED Results / Procedures / Treatments   Labs (all labs ordered are listed, but only abnormal results are  displayed) Labs Reviewed  BASIC METABOLIC PANEL - Abnormal; Notable for the following components:      Result Value   Sodium 134 (*)    Glucose, Bld 152 (*)    All other components within  normal limits  CBC  TROPONIN I (HIGH SENSITIVITY)  TROPONIN I (HIGH SENSITIVITY)    EKG EKG Interpretation  Date/Time:  Friday September 26 2020 14:12:35 EDT Ventricular Rate:  85 PR Interval:  154 QRS Duration: 86 QT Interval:  364 QTC Calculation: 433 R Axis:   36 Text Interpretation: Normal sinus rhythm Nonspecific ST abnormality Abnormal ECG No old tracing for comparison Confirmed by Nanda Quinton [phone]) on 09/26/2020 2:25:09 PM   Radiology DG Chest 2 View  Result Date: 09/26/2020 CLINICAL DATA:  Chest pain EXAM: CHEST - 2 VIEW COMPARISON:  None. FINDINGS: The heart size and mediastinal contours are within normal limits. Mild, diffuse interstitial pulmonary opacity. The visualized skeletal structures are unremarkable. IMPRESSION: Mild, diffuse interstitial pulmonary opacity, which may reflect edema. No focal airspace opacity. Electronically Signed   By: Eddie Candle M.D.   On: 09/26/2020 14:52    Procedures Procedures   Medications Ordered in ED Medications  aspirin chewable tablet 324 mg (324 mg Oral Given 09/26/20 1526)    ED Course  I have reviewed the triage vital signs and the nursing notes.  Pertinent labs & imaging results that were available during my care of the patient were reviewed by me and considered in my medical decision making (see chart for details).  Clinical Course as of 09/27/20 1126  Fri Sep 26, 2020  1513 ED EKG [EW]    Clinical Course User Index [EW] Daleen Bo, MD   MDM Rules/Calculators/A&P                           Patient Vitals for the past 24 hrs:  BP Temp Temp src Pulse Resp SpO2  09/26/20 1830 (!) 126/57 98.6 F (37 C) Oral 65 13 94 %  09/26/20 1800 134/63 -- -- 65 18 96 %  09/26/20 1730 130/69 -- -- 66 15 96 %  09/26/20 1700 124/68 -- -- 61  12 95 %  09/26/20 1645 126/64 -- -- 66 16 96 %  09/26/20 1600 (!) 144/69 -- -- 71 15 98 %  09/26/20 1530 (!) 158/66 -- -- 78 15 97 %  09/26/20 1500 (!) 163/59 -- -- 85 18 98 %  09/26/20 1410 (!) 152/66 98.6 F (37 C) Oral 88 20 96 %    -6:45 PM reevaluation with update and discussion. After initial assessment and treatment, an updated evaluation reveals she is comfortable and has no further complaints. Daleen Bo   Medical Decision Making:  This patient is presenting for evaluation of chest discomfort, which does require a range of treatment options, and is a complaint that involves a moderate risk of morbidity and mortality. The differential diagnoses include ACS, pneumonia, chest wall pain, anxiety. I decided to review old records, and in summary elderly female presenting with noncardiac chest pain, and a history of coronary artery disease.  I do not recall additional historical information from anyone.  Clinical Laboratory Tests Ordered, included CBC, Metabolic panel and Delta troponin. Review indicates normal except sodium low, glucose high. Radiologic Tests Ordered, included chest x-ray.  I independently Visualized: Radiograph images, which show no acute abnormalities    Critical Interventions-clinical evaluation, laboratory testing, radiography, observation reassessment  After These Interventions, the Patient was reevaluated and was found with reassuring evaluation.  Chest x-ray indicates mild pulmonary vascular congestion.  Last cardiac echo, 2017 showed normal systolic ejection fraction.  Patient without respiratory distress.  No indication for additional diuresis at this time.  She  currently takes diuretic medications, and is mildly hypokalemic.  Stable for discharge with outpatient management.  Nonspecific chest discomfort with breast discomfort, will need outpatient follow-up by PCP.  CRITICAL CARE-no Performed by: Daleen Bo  Nursing Notes Reviewed/ Care  Coordinated Applicable Imaging Reviewed Interpretation of Laboratory Data incorporated into ED treatment  The patient appears reasonably screened and/or stabilized for discharge and I doubt any other medical condition or other [facility] requiring further screening, evaluation, or treatment in the ED at this time prior to discharge.  Plan: Home Medications-continue usual; Home Treatments-increase potassium in diet; return here if the recommended treatment, does not improve the symptoms; Recommended follow up-PCP follow-up 1 week and as needed     Final Clinical Impression(s) / ED Diagnoses Final diagnoses:  Nonspecific chest pain    Rx / DC Orders ED Discharge Orders    None       Daleen Bo, MD 09/27/20 1129

## 2020-10-04 DIAGNOSIS — H40033 Anatomical narrow angle, bilateral: Secondary | ICD-10-CM | POA: Diagnosis not present

## 2020-10-04 DIAGNOSIS — H04123 Dry eye syndrome of bilateral lacrimal glands: Secondary | ICD-10-CM | POA: Diagnosis not present

## 2020-10-23 DIAGNOSIS — I209 Angina pectoris, unspecified: Secondary | ICD-10-CM | POA: Diagnosis not present

## 2020-10-23 DIAGNOSIS — I082 Rheumatic disorders of both aortic and tricuspid valves: Secondary | ICD-10-CM | POA: Diagnosis not present

## 2020-10-23 DIAGNOSIS — I5189 Other ill-defined heart diseases: Secondary | ICD-10-CM | POA: Diagnosis not present

## 2020-10-23 DIAGNOSIS — I517 Cardiomegaly: Secondary | ICD-10-CM | POA: Diagnosis not present

## 2020-10-28 DIAGNOSIS — L989 Disorder of the skin and subcutaneous tissue, unspecified: Secondary | ICD-10-CM | POA: Diagnosis not present

## 2020-10-28 DIAGNOSIS — L03811 Cellulitis of head [any part, except face]: Secondary | ICD-10-CM | POA: Diagnosis not present

## 2020-12-03 DIAGNOSIS — I10 Essential (primary) hypertension: Secondary | ICD-10-CM | POA: Diagnosis not present

## 2020-12-03 DIAGNOSIS — E1143 Type 2 diabetes mellitus with diabetic autonomic (poly)neuropathy: Secondary | ICD-10-CM | POA: Diagnosis not present

## 2020-12-03 DIAGNOSIS — E785 Hyperlipidemia, unspecified: Secondary | ICD-10-CM | POA: Diagnosis not present

## 2020-12-03 DIAGNOSIS — G4730 Sleep apnea, unspecified: Secondary | ICD-10-CM | POA: Diagnosis not present

## 2020-12-05 DIAGNOSIS — E785 Hyperlipidemia, unspecified: Secondary | ICD-10-CM | POA: Diagnosis not present

## 2020-12-05 DIAGNOSIS — I10 Essential (primary) hypertension: Secondary | ICD-10-CM | POA: Diagnosis not present

## 2020-12-05 DIAGNOSIS — E1143 Type 2 diabetes mellitus with diabetic autonomic (poly)neuropathy: Secondary | ICD-10-CM | POA: Diagnosis not present

## 2020-12-05 DIAGNOSIS — G4730 Sleep apnea, unspecified: Secondary | ICD-10-CM | POA: Diagnosis not present

## 2021-01-15 DIAGNOSIS — Z1231 Encounter for screening mammogram for malignant neoplasm of breast: Secondary | ICD-10-CM | POA: Diagnosis not present

## 2021-01-28 ENCOUNTER — Telehealth: Payer: Self-pay | Admitting: Neurology

## 2021-01-28 NOTE — Telephone Encounter (Signed)
Pt called to canx her apt tomorrow, she said she doesn't know if she will be able to make it. She is feeling horrible today. Said she has to take it day by day, so she isnt sure what tomorrow will bring. She stopped her zonisamide about 69mo or so, said it made her feel worse. I let her know jaffes next appt was in April of 23 and she was upset. I told her I would reach out to see if he had  another option for her. She would like a call back to discuss.

## 2021-01-28 NOTE — Telephone Encounter (Signed)
She is not able to do a VV on phone or computer. Said she doesn't have either one.
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
## 2021-01-29 ENCOUNTER — Ambulatory Visit: Payer: Medicare Other | Admitting: Neurology

## 2021-02-14 DIAGNOSIS — E119 Type 2 diabetes mellitus without complications: Secondary | ICD-10-CM | POA: Diagnosis not present

## 2021-02-14 DIAGNOSIS — F32A Depression, unspecified: Secondary | ICD-10-CM | POA: Diagnosis not present

## 2021-02-14 DIAGNOSIS — Z79899 Other long term (current) drug therapy: Secondary | ICD-10-CM | POA: Diagnosis not present

## 2021-02-14 DIAGNOSIS — R059 Cough, unspecified: Secondary | ICD-10-CM | POA: Diagnosis not present

## 2021-02-14 DIAGNOSIS — Z8673 Personal history of transient ischemic attack (TIA), and cerebral infarction without residual deficits: Secondary | ICD-10-CM | POA: Diagnosis not present

## 2021-02-14 DIAGNOSIS — R519 Headache, unspecified: Secondary | ICD-10-CM | POA: Diagnosis not present

## 2021-02-14 DIAGNOSIS — Z882 Allergy status to sulfonamides status: Secondary | ICD-10-CM | POA: Diagnosis not present

## 2021-02-14 DIAGNOSIS — Z7984 Long term (current) use of oral hypoglycemic drugs: Secondary | ICD-10-CM | POA: Diagnosis not present

## 2021-02-14 DIAGNOSIS — I10 Essential (primary) hypertension: Secondary | ICD-10-CM | POA: Diagnosis not present

## 2021-02-14 DIAGNOSIS — K589 Irritable bowel syndrome without diarrhea: Secondary | ICD-10-CM | POA: Diagnosis not present

## 2021-02-14 DIAGNOSIS — U071 COVID-19: Secondary | ICD-10-CM | POA: Diagnosis not present

## 2021-02-14 DIAGNOSIS — Z87891 Personal history of nicotine dependence: Secondary | ICD-10-CM | POA: Diagnosis not present

## 2021-03-05 DIAGNOSIS — Z23 Encounter for immunization: Secondary | ICD-10-CM | POA: Diagnosis not present

## 2021-03-24 DIAGNOSIS — E785 Hyperlipidemia, unspecified: Secondary | ICD-10-CM | POA: Diagnosis not present

## 2021-03-24 DIAGNOSIS — E1143 Type 2 diabetes mellitus with diabetic autonomic (poly)neuropathy: Secondary | ICD-10-CM | POA: Diagnosis not present

## 2021-03-24 DIAGNOSIS — I10 Essential (primary) hypertension: Secondary | ICD-10-CM | POA: Diagnosis not present

## 2021-03-24 DIAGNOSIS — Z1331 Encounter for screening for depression: Secondary | ICD-10-CM | POA: Diagnosis not present

## 2021-03-24 DIAGNOSIS — G4730 Sleep apnea, unspecified: Secondary | ICD-10-CM | POA: Diagnosis not present

## 2021-03-24 DIAGNOSIS — Z0000 Encounter for general adult medical examination without abnormal findings: Secondary | ICD-10-CM | POA: Diagnosis not present

## 2021-05-06 ENCOUNTER — Encounter: Payer: Self-pay | Admitting: Neurology

## 2021-06-15 DIAGNOSIS — I201 Angina pectoris with documented spasm: Secondary | ICD-10-CM | POA: Diagnosis not present

## 2021-07-02 ENCOUNTER — Other Ambulatory Visit (HOSPITAL_COMMUNITY): Payer: Self-pay | Admitting: Internal Medicine

## 2021-07-02 DIAGNOSIS — I201 Angina pectoris with documented spasm: Secondary | ICD-10-CM

## 2021-07-09 ENCOUNTER — Ambulatory Visit (HOSPITAL_COMMUNITY)
Admission: RE | Admit: 2021-07-09 | Payer: Medicare Other | Source: Ambulatory Visit | Attending: Internal Medicine | Admitting: Internal Medicine

## 2021-07-09 DIAGNOSIS — J09X3 Influenza due to identified novel influenza A virus with gastrointestinal manifestations: Secondary | ICD-10-CM | POA: Diagnosis not present

## 2021-07-09 DIAGNOSIS — J208 Acute bronchitis due to other specified organisms: Secondary | ICD-10-CM | POA: Diagnosis not present

## 2021-07-10 ENCOUNTER — Ambulatory Visit (HOSPITAL_COMMUNITY): Payer: Medicare Other

## 2021-08-03 ENCOUNTER — Telehealth (HOSPITAL_COMMUNITY): Payer: Self-pay | Admitting: *Deleted

## 2021-08-03 NOTE — Telephone Encounter (Signed)
Close encounter 

## 2021-08-04 ENCOUNTER — Ambulatory Visit (HOSPITAL_COMMUNITY)
Admission: RE | Admit: 2021-08-04 | Discharge: 2021-08-04 | Disposition: A | Discharge status: Home / Self Care | Payer: Medicare Other | Source: Ambulatory Visit | Attending: Internal Medicine | Admitting: Internal Medicine

## 2021-08-04 DIAGNOSIS — I201 Angina pectoris with documented spasm: Secondary | ICD-10-CM

## 2021-08-04 MED ORDER — TECHNETIUM TC 99M TETROFOSMIN IV KIT
31.3000 | PACK | Freq: Once | INTRAVENOUS | Status: AC | PRN
  Administered 2021-08-04: 31.3 via INTRAVENOUS
  Filled 2021-08-04: qty 32

## 2021-08-05 ENCOUNTER — Other Ambulatory Visit: Payer: Self-pay | Admitting: *Deleted

## 2021-08-05 ENCOUNTER — Ambulatory Visit (HOSPITAL_COMMUNITY)
Admission: RE | Admit: 2021-08-05 | Discharge: 2021-08-05 | Disposition: A | Discharge status: Home / Self Care | Payer: Medicare Other | Source: Ambulatory Visit | Attending: Cardiology | Admitting: Cardiology

## 2021-08-05 DIAGNOSIS — I201 Angina pectoris with documented spasm: Secondary | ICD-10-CM | POA: Insufficient documentation

## 2021-08-05 LAB — MYOCARDIAL PERFUSION IMAGING
LV dias vol: 113 mL (ref 46–106)
LV sys vol: 44 mL
Nuc Stress EF: 61 %
Peak HR: 87 {beats}/min
Rest HR: 55 {beats}/min
Rest Nuclear Isotope Dose: 31.3 mCi
Rest Nuclear Isotope Dose: 31.3 mCi
SDS: 2
SRS: 5
SSS: 7
ST Depression (mm): 0 mm
Stress Nuclear Isotope Dose: 29 mCi
Stress Nuclear Isotope Dose: 29 mCi
TID: 0.99

## 2021-08-05 MED ORDER — TECHNETIUM TC 99M TETROFOSMIN IV KIT: 29.0000 | PACK | Freq: Once | INTRAVENOUS | Status: DC | PRN

## 2021-08-05 MED ORDER — TECHNETIUM TC 99M TETROFOSMIN IV KIT
29.0000 | PACK | Freq: Once | INTRAVENOUS | Status: AC | PRN
  Administered 2021-08-05: 29 via INTRAVENOUS
  Filled 2021-08-05: qty 29

## 2021-08-05 MED ORDER — REGADENOSON 0.4 MG/5ML IV SOLN
0.4000 mg | Freq: Once | INTRAVENOUS | Status: AC
  Administered 2021-08-05: 0.4 mg via INTRAVENOUS

## 2021-08-05 MED ORDER — AMINOPHYLLINE 25 MG/ML IV SOLN: 150.0000 mg | Freq: Once | INTRAVENOUS | Status: DC

## 2021-08-05 MED ORDER — REGADENOSON 0.4 MG/5ML IV SOLN: 0.4000 mg | Freq: Once | INTRAVENOUS | Status: DC

## 2021-08-05 MED ORDER — AMINOPHYLLINE 25 MG/ML IV SOLN
150.0000 mg | Freq: Once | INTRAVENOUS | Status: AC
  Administered 2021-08-05: 150 mg via INTRAVENOUS

## 2021-08-05 NOTE — Progress Notes (Signed)
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
## 2021-08-07 ENCOUNTER — Ambulatory Visit (INDEPENDENT_AMBULATORY_CARE_PROVIDER_SITE_OTHER): Payer: Medicare Other | Admitting: Neurology

## 2021-08-07 ENCOUNTER — Encounter: Payer: Self-pay | Admitting: Neurology

## 2021-08-07 VITALS — BP 148/73 | HR 65 | Ht 63.0 in | Wt 242.2 lb

## 2021-08-07 DIAGNOSIS — F419 Anxiety disorder, unspecified: Secondary | ICD-10-CM | POA: Diagnosis not present

## 2021-08-07 DIAGNOSIS — Z9989 Dependence on other enabling machines and devices: Secondary | ICD-10-CM

## 2021-08-07 DIAGNOSIS — G4733 Obstructive sleep apnea (adult) (pediatric): Secondary | ICD-10-CM | POA: Diagnosis not present

## 2021-08-07 DIAGNOSIS — D219 Benign neoplasm of connective and other soft tissue, unspecified: Secondary | ICD-10-CM

## 2021-08-07 DIAGNOSIS — R519 Headache, unspecified: Secondary | ICD-10-CM | POA: Diagnosis not present

## 2021-08-07 DIAGNOSIS — R42 Dizziness and giddiness: Secondary | ICD-10-CM | POA: Diagnosis not present

## 2021-08-07 DIAGNOSIS — I201 Angina pectoris with documented spasm: Secondary | ICD-10-CM | POA: Diagnosis not present

## 2021-08-07 MED ORDER — SERTRALINE HCL 25 MG PO TABS: 25.0000 mg | ORAL_TABLET | Freq: Every day | ORAL | 5 refills | Status: DC

## 2021-08-07 NOTE — Patient Instructions (Signed)
Start sertraline '25mg'$  daily.  If no improvement in headaches in 6 weeks, contact me ?Limit use of pain relievers to no more than 2 days out of week to prevent risk of rebound or medication-overuse headache. ?Get your CPAP checked ?Keep headache diary ?Follow up 6 months. ?

## 2021-08-10 ENCOUNTER — Telehealth: Payer: Self-pay | Admitting: Neurology

## 2021-08-10 NOTE — Telephone Encounter (Signed)
Pt advised of DR.Jaffe note below, ?Does she want to try another medication or does she want to hold off on taking something else.  If she is open to another medication, I would recommend nortriptyline '10mg'$  at bedtime.  We can increase dose in 6 weeks if needed.  ? ?Per pt right now she do not want to start anything else. She do not want anything that is addictive.  ?She really do not want to take the Zoloft. ? ?Advised patient to call us if she needs to.  ?

## 2021-08-10 NOTE — Telephone Encounter (Signed)
Pt called in stating she is not going to be taking the Zoloft Dr. Tomi Likens had prescribed. She has taken it years ago and she was numb and achy and ended up having to leave her job due to it.  ?

## 2021-08-14 ENCOUNTER — Ambulatory Visit: Payer: Medicare Other | Admitting: Neurology

## 2021-09-16 DIAGNOSIS — R002 Palpitations: Secondary | ICD-10-CM | POA: Diagnosis not present

## 2021-09-16 DIAGNOSIS — I209 Angina pectoris, unspecified: Secondary | ICD-10-CM | POA: Diagnosis not present

## 2021-09-16 DIAGNOSIS — R9439 Abnormal result of other cardiovascular function study: Secondary | ICD-10-CM | POA: Insufficient documentation

## 2021-09-16 DIAGNOSIS — I10 Essential (primary) hypertension: Secondary | ICD-10-CM | POA: Insufficient documentation

## 2021-09-16 DIAGNOSIS — R29818 Other symptoms and signs involving the nervous system: Secondary | ICD-10-CM | POA: Diagnosis not present

## 2021-09-16 DIAGNOSIS — E1169 Type 2 diabetes mellitus with other specified complication: Secondary | ICD-10-CM | POA: Insufficient documentation

## 2021-09-16 DIAGNOSIS — E785 Hyperlipidemia, unspecified: Secondary | ICD-10-CM | POA: Diagnosis not present

## 2021-09-16 DIAGNOSIS — I25118 Atherosclerotic heart disease of native coronary artery with other forms of angina pectoris: Secondary | ICD-10-CM | POA: Insufficient documentation

## 2021-09-23 DIAGNOSIS — R002 Palpitations: Secondary | ICD-10-CM | POA: Diagnosis not present

## 2021-10-02 DIAGNOSIS — R519 Headache, unspecified: Secondary | ICD-10-CM | POA: Diagnosis not present

## 2021-10-02 DIAGNOSIS — I2510 Atherosclerotic heart disease of native coronary artery without angina pectoris: Secondary | ICD-10-CM | POA: Diagnosis not present

## 2021-10-02 DIAGNOSIS — Z79899 Other long term (current) drug therapy: Secondary | ICD-10-CM | POA: Diagnosis not present

## 2021-10-02 DIAGNOSIS — R9439 Abnormal result of other cardiovascular function study: Secondary | ICD-10-CM | POA: Diagnosis not present

## 2021-10-02 DIAGNOSIS — Z8673 Personal history of transient ischemic attack (TIA), and cerebral infarction without residual deficits: Secondary | ICD-10-CM | POA: Diagnosis not present

## 2021-10-02 DIAGNOSIS — I10 Essential (primary) hypertension: Secondary | ICD-10-CM | POA: Diagnosis not present

## 2021-10-02 DIAGNOSIS — I209 Angina pectoris, unspecified: Secondary | ICD-10-CM | POA: Diagnosis not present

## 2021-10-02 DIAGNOSIS — Z87891 Personal history of nicotine dependence: Secondary | ICD-10-CM | POA: Diagnosis not present

## 2021-10-02 DIAGNOSIS — E119 Type 2 diabetes mellitus without complications: Secondary | ICD-10-CM | POA: Diagnosis not present

## 2021-10-02 DIAGNOSIS — Z7982 Long term (current) use of aspirin: Secondary | ICD-10-CM | POA: Diagnosis not present

## 2021-10-02 DIAGNOSIS — E785 Hyperlipidemia, unspecified: Secondary | ICD-10-CM | POA: Diagnosis not present

## 2021-10-02 DIAGNOSIS — Z7984 Long term (current) use of oral hypoglycemic drugs: Secondary | ICD-10-CM | POA: Diagnosis not present

## 2021-10-04 DIAGNOSIS — R9439 Abnormal result of other cardiovascular function study: Secondary | ICD-10-CM | POA: Diagnosis not present

## 2021-10-04 DIAGNOSIS — I209 Angina pectoris, unspecified: Secondary | ICD-10-CM | POA: Diagnosis not present

## 2021-10-05 DIAGNOSIS — R079 Chest pain, unspecified: Secondary | ICD-10-CM | POA: Diagnosis not present

## 2021-10-05 DIAGNOSIS — M79601 Pain in right arm: Secondary | ICD-10-CM | POA: Diagnosis not present

## 2021-10-05 DIAGNOSIS — I4510 Unspecified right bundle-branch block: Secondary | ICD-10-CM | POA: Diagnosis not present

## 2021-10-05 DIAGNOSIS — S40021A Contusion of right upper arm, initial encounter: Secondary | ICD-10-CM | POA: Diagnosis not present

## 2021-10-05 DIAGNOSIS — Z9889 Other specified postprocedural states: Secondary | ICD-10-CM | POA: Diagnosis not present

## 2021-10-05 DIAGNOSIS — X58XXXA Exposure to other specified factors, initial encounter: Secondary | ICD-10-CM | POA: Diagnosis not present

## 2021-10-09 DIAGNOSIS — R002 Palpitations: Secondary | ICD-10-CM | POA: Diagnosis not present

## 2021-10-12 DIAGNOSIS — F410 Panic disorder [episodic paroxysmal anxiety] without agoraphobia: Secondary | ICD-10-CM | POA: Diagnosis not present

## 2021-11-30 DIAGNOSIS — I25118 Atherosclerotic heart disease of native coronary artery with other forms of angina pectoris: Secondary | ICD-10-CM | POA: Diagnosis not present

## 2021-11-30 DIAGNOSIS — E1169 Type 2 diabetes mellitus with other specified complication: Secondary | ICD-10-CM | POA: Diagnosis not present

## 2021-11-30 DIAGNOSIS — R29818 Other symptoms and signs involving the nervous system: Secondary | ICD-10-CM | POA: Diagnosis not present

## 2021-11-30 DIAGNOSIS — I10 Essential (primary) hypertension: Secondary | ICD-10-CM | POA: Diagnosis not present

## 2021-11-30 DIAGNOSIS — E785 Hyperlipidemia, unspecified: Secondary | ICD-10-CM | POA: Diagnosis not present

## 2022-01-14 ENCOUNTER — Encounter: Payer: Self-pay | Admitting: Orthopaedic Surgery

## 2022-01-14 ENCOUNTER — Ambulatory Visit (INDEPENDENT_AMBULATORY_CARE_PROVIDER_SITE_OTHER): Payer: Medicare Other

## 2022-01-14 ENCOUNTER — Ambulatory Visit (INDEPENDENT_AMBULATORY_CARE_PROVIDER_SITE_OTHER): Payer: Medicare Other | Admitting: Orthopaedic Surgery

## 2022-01-14 VITALS — Ht 62.5 in | Wt 240.0 lb

## 2022-01-14 DIAGNOSIS — M1A0610 Idiopathic chronic gout, right knee, without tophus (tophi): Secondary | ICD-10-CM | POA: Diagnosis not present

## 2022-01-14 DIAGNOSIS — M25512 Pain in left shoulder: Secondary | ICD-10-CM

## 2022-01-14 DIAGNOSIS — M7542 Impingement syndrome of left shoulder: Secondary | ICD-10-CM | POA: Diagnosis not present

## 2022-01-14 DIAGNOSIS — I201 Angina pectoris with documented spasm: Secondary | ICD-10-CM | POA: Diagnosis not present

## 2022-01-14 MED ORDER — LIDOCAINE HCL 1 % IJ SOLN
0.5000 mL | INTRAMUSCULAR | Status: AC | PRN
  Administered 2022-01-14: .5 mL

## 2022-01-14 MED ORDER — METHYLPREDNISOLONE ACETATE 40 MG/ML IJ SUSP
40.0000 mg | INTRAMUSCULAR | Status: AC | PRN
  Administered 2022-01-14: 40 mg via INTRA_ARTICULAR

## 2022-01-14 MED ORDER — BUPIVACAINE HCL 0.25 % IJ SOLN
4.0000 mL | INTRAMUSCULAR | Status: AC | PRN
  Administered 2022-01-14: 4 mL via INTRA_ARTICULAR

## 2022-01-14 NOTE — Progress Notes (Signed)
Office Visit Note   Patient: Andrea Rios           Date of Birth: Oct 15, 1950           MRN: [id number] Visit Date: 01/14/2022              Requested by: Neale Burly, MD [place],  [place] [id number] PCP: Neale Burly, MD   Assessment & Plan: Visit Diagnoses:  1. Acute pain of left shoulder   2. Idiopathic chronic gout of right knee without tophus   3. Impingement syndrome of left shoulder     Plan: Postinjection she had improvement in her pain still had some pain with impingement and still mild pain with resisted supraspinatus isolated testing but less painful.  We will see how she does with this over the next week or so if she is continuing to have pain she will call us and we can proceed with a left shoulder MRI scan to rule out rotator cuff tear.  Follow-Up Instructions: No follow-ups on file.   Orders:  Orders Placed This Encounter  Procedures   Large Joint Inj: R knee   XR Shoulder Left   No orders of the defined types were placed in this encounter.     Procedures: Large Joint Inj: R knee on 01/14/2022 3:18 PM Indications: pain and joint swelling Details: 22 G 1.5 in needle, anterolateral approach  Arthrogram: No  Medications: 40 mg methylPREDNISolone acetate 40 MG/ML; 0.5 mL lidocaine 1 %; 4 mL bupivacaine 0.25 % Outcome: tolerated well, no immediate complications Procedure, treatment alternatives, risks and benefits explained, specific risks discussed. Consent was given by the patient. Immediately prior to procedure a time out was called to verify the correct patient, procedure, equipment, support staff and site/side marked as required. Patient was prepped and draped in the usual sterile fashion.       Clinical Data: No additional findings.   Subjective: Chief Complaint  Patient presents with   Left Shoulder - Pain    HPI 71 year old female from [place] states she was at [employer] out like carrying a heavy chair and bag was  started pouring rain she was running trying to hold outstretched and had significant extreme pain in her left shoulder.  Since that time she states she cannot sleep she has to put her arm across her chest at night if she holds it down it wakes her up she has trouble fixing her hair and states she is miserable.  She took ibuprofen.  She is right-hand dominant.  No past history of injury to her shoulder prior to that.  She states oral prednisone makes her jittery.  Patient referred by Dr.Hasanaj .   Review of Systems all of systems noncontributory to HPI.  Of note is Glucophage Amaryl and blood pressure medication.   Objective: Vital Signs: Ht 5' 2.5" (1.588 m)   Wt 240 lb (108.9 kg)   BMI 43.20 kg/m   Physical Exam Constitutional:      Appearance: She is well-developed.  HENT:     Head: Normocephalic.     Right Ear: External ear normal.     Left Ear: External ear normal. There is no impacted cerumen.  Eyes:     Pupils: Pupils are equal, round, and reactive to light.  Neck:     Thyroid: No thyromegaly.     Trachea: No tracheal deviation.  Cardiovascular:     Rate and Rhythm: Normal rate.  Pulmonary:  Effort: Pulmonary effort is normal.  Abdominal:     Palpations: Abdomen is soft.  Musculoskeletal:     Cervical back: No rigidity.  Skin:    General: Skin is warm and dry.  Neurological:     Mental Status: She is alert and oriented to person, place, and time.  Psychiatric:        Behavior: Behavior normal.     Ortho Exam positive impingement left shoulder negative right no brachial plexus tenderness.  She does take resistance in the empty can test but has pain.  Negative lift off.  Long head of the biceps is normal sensation to the hand is normal.  Specialty Comments:  No specialty comments available.  Imaging: XR Shoulder Left  Result Date: 01/14/2022 Three-view x-rays left shoulder obtained and reviewed.  Minimal signs acromioclavicular degenerative changes.  She may  have slight calcification near the superior glenoid or possibly a supraspinatus mild calcification.  Glenoid and humeral head alignment is normal. Impression: Left shoulder negative for acute changes.     PMFS History: Patient Active Problem List   Diagnosis Date Noted   Impingement syndrome of left shoulder 01/14/2022   Precordial pain 10/09/2012   Past Medical History:  Diagnosis Date   Asthma    Back pain    Cerebrovascular disease    Constipation    Diabetes ([place])    Fibromyalgia    H/O colonoscopy 04/12/2014   polp, diverticulosis, Dr. Britta Mccreedy   H/O mammogram 11/2014   normal   HTN (hypertension)    Hyperlipidemia    Lumbago    Obesity    Paronychia of toe    Snoring    Stroke [facility]) 2003    Family History  Problem Relation Age of Onset   Heart failure Mother    Heart failure Father    Parkinson's disease Sister    Lung cancer Sister     Past Surgical History:  Procedure Laterality Date   GALLBLADDER SURGERY     HERNIA REPAIR     x3   LEFT HEART CATHETERIZATION WITH CORONARY ANGIOGRAM N/A 10/09/2012   Procedure: LEFT HEART CATHETERIZATION WITH CORONARY ANGIOGRAM;  Surgeon: Peter M Martinique, MD;  Location: [facility] CATH LAB;  Service: Cardiovascular;  Laterality: N/A;   TONSILLECTOMY AND ADENOIDECTOMY     Social History   Occupational History   Not on file  Tobacco Use   Smoking status: Former    Packs/day: 2.00    Years: 12.00    Total pack years: 24.00    Types: Cigarettes    Start date: 06/19/1965    Quit date: 04/26/1977    Years since quitting: 44.7   Smokeless tobacco: Never  Vaping Use   Vaping Use: Never used  Substance and Sexual Activity   Alcohol use: Not Currently    Comment: occasional   Drug use: Not on file   Sexual activity: Not on file

## 2022-01-26 DIAGNOSIS — I10 Essential (primary) hypertension: Secondary | ICD-10-CM | POA: Diagnosis not present

## 2022-01-26 DIAGNOSIS — G4730 Sleep apnea, unspecified: Secondary | ICD-10-CM | POA: Diagnosis not present

## 2022-01-26 DIAGNOSIS — E785 Hyperlipidemia, unspecified: Secondary | ICD-10-CM | POA: Diagnosis not present

## 2022-01-26 DIAGNOSIS — E1143 Type 2 diabetes mellitus with diabetic autonomic (poly)neuropathy: Secondary | ICD-10-CM | POA: Diagnosis not present

## 2022-01-26 DIAGNOSIS — F410 Panic disorder [episodic paroxysmal anxiety] without agoraphobia: Secondary | ICD-10-CM | POA: Diagnosis not present

## 2022-02-08 ENCOUNTER — Ambulatory Visit: Payer: Medicare Other | Admitting: Neurology

## 2022-03-05 DIAGNOSIS — Z7984 Long term (current) use of oral hypoglycemic drugs: Secondary | ICD-10-CM | POA: Diagnosis not present

## 2022-03-05 DIAGNOSIS — Z7982 Long term (current) use of aspirin: Secondary | ICD-10-CM | POA: Diagnosis not present

## 2022-03-05 DIAGNOSIS — I2510 Atherosclerotic heart disease of native coronary artery without angina pectoris: Secondary | ICD-10-CM | POA: Diagnosis not present

## 2022-03-05 DIAGNOSIS — M1711 Unilateral primary osteoarthritis, right knee: Secondary | ICD-10-CM | POA: Diagnosis not present

## 2022-03-05 DIAGNOSIS — M79661 Pain in right lower leg: Secondary | ICD-10-CM | POA: Diagnosis not present

## 2022-03-05 DIAGNOSIS — I10 Essential (primary) hypertension: Secondary | ICD-10-CM | POA: Diagnosis not present

## 2022-03-05 DIAGNOSIS — M25561 Pain in right knee: Secondary | ICD-10-CM | POA: Diagnosis not present

## 2022-03-05 DIAGNOSIS — G4733 Obstructive sleep apnea (adult) (pediatric): Secondary | ICD-10-CM | POA: Diagnosis not present

## 2022-03-05 DIAGNOSIS — E119 Type 2 diabetes mellitus without complications: Secondary | ICD-10-CM | POA: Diagnosis not present

## 2022-03-05 DIAGNOSIS — J449 Chronic obstructive pulmonary disease, unspecified: Secondary | ICD-10-CM | POA: Diagnosis not present

## 2022-04-01 DIAGNOSIS — M1711 Unilateral primary osteoarthritis, right knee: Secondary | ICD-10-CM | POA: Diagnosis not present

## 2022-04-01 DIAGNOSIS — M79604 Pain in right leg: Secondary | ICD-10-CM | POA: Diagnosis not present

## 2022-04-04 DIAGNOSIS — G4733 Obstructive sleep apnea (adult) (pediatric): Secondary | ICD-10-CM | POA: Diagnosis not present

## 2022-05-05 DIAGNOSIS — G4733 Obstructive sleep apnea (adult) (pediatric): Secondary | ICD-10-CM | POA: Diagnosis not present

## 2022-05-20 DIAGNOSIS — Z7984 Long term (current) use of oral hypoglycemic drugs: Secondary | ICD-10-CM | POA: Diagnosis not present

## 2022-05-20 DIAGNOSIS — H25013 Cortical age-related cataract, bilateral: Secondary | ICD-10-CM | POA: Diagnosis not present

## 2022-05-20 DIAGNOSIS — H2513 Age-related nuclear cataract, bilateral: Secondary | ICD-10-CM | POA: Diagnosis not present

## 2022-05-20 DIAGNOSIS — E1169 Type 2 diabetes mellitus with other specified complication: Secondary | ICD-10-CM | POA: Diagnosis not present

## 2022-05-31 DIAGNOSIS — E785 Hyperlipidemia, unspecified: Secondary | ICD-10-CM | POA: Diagnosis not present

## 2022-05-31 DIAGNOSIS — G4730 Sleep apnea, unspecified: Secondary | ICD-10-CM | POA: Diagnosis not present

## 2022-05-31 DIAGNOSIS — E1143 Type 2 diabetes mellitus with diabetic autonomic (poly)neuropathy: Secondary | ICD-10-CM | POA: Diagnosis not present

## 2022-05-31 DIAGNOSIS — I10 Essential (primary) hypertension: Secondary | ICD-10-CM | POA: Diagnosis not present

## 2022-05-31 DIAGNOSIS — Z0000 Encounter for general adult medical examination without abnormal findings: Secondary | ICD-10-CM | POA: Diagnosis not present

## 2022-06-01 DIAGNOSIS — I10 Essential (primary) hypertension: Secondary | ICD-10-CM | POA: Diagnosis not present

## 2022-06-01 DIAGNOSIS — E1169 Type 2 diabetes mellitus with other specified complication: Secondary | ICD-10-CM | POA: Diagnosis not present

## 2022-06-01 DIAGNOSIS — E785 Hyperlipidemia, unspecified: Secondary | ICD-10-CM | POA: Diagnosis not present

## 2022-06-01 DIAGNOSIS — I25118 Atherosclerotic heart disease of native coronary artery with other forms of angina pectoris: Secondary | ICD-10-CM | POA: Diagnosis not present

## 2022-06-05 DIAGNOSIS — G4733 Obstructive sleep apnea (adult) (pediatric): Secondary | ICD-10-CM | POA: Diagnosis not present

## 2022-06-22 DIAGNOSIS — H25811 Combined forms of age-related cataract, right eye: Secondary | ICD-10-CM | POA: Diagnosis not present

## 2022-06-22 DIAGNOSIS — H25812 Combined forms of age-related cataract, left eye: Secondary | ICD-10-CM | POA: Diagnosis not present

## 2022-07-04 DIAGNOSIS — G4733 Obstructive sleep apnea (adult) (pediatric): Secondary | ICD-10-CM | POA: Diagnosis not present

## 2022-07-08 DIAGNOSIS — M1711 Unilateral primary osteoarthritis, right knee: Secondary | ICD-10-CM | POA: Diagnosis not present

## 2022-07-12 DIAGNOSIS — H269 Unspecified cataract: Secondary | ICD-10-CM | POA: Diagnosis not present

## 2022-07-12 DIAGNOSIS — H25811 Combined forms of age-related cataract, right eye: Secondary | ICD-10-CM | POA: Diagnosis not present

## 2022-08-02 DIAGNOSIS — G4733 Obstructive sleep apnea (adult) (pediatric): Secondary | ICD-10-CM | POA: Diagnosis not present

## 2022-08-04 DIAGNOSIS — G4733 Obstructive sleep apnea (adult) (pediatric): Secondary | ICD-10-CM | POA: Diagnosis not present

## 2022-08-17 DIAGNOSIS — M25561 Pain in right knee: Secondary | ICD-10-CM | POA: Diagnosis not present

## 2022-08-17 DIAGNOSIS — G8929 Other chronic pain: Secondary | ICD-10-CM | POA: Diagnosis not present

## 2022-08-17 DIAGNOSIS — M5416 Radiculopathy, lumbar region: Secondary | ICD-10-CM | POA: Diagnosis not present

## 2022-09-03 DIAGNOSIS — M1711 Unilateral primary osteoarthritis, right knee: Secondary | ICD-10-CM | POA: Diagnosis not present

## 2022-09-03 DIAGNOSIS — G4733 Obstructive sleep apnea (adult) (pediatric): Secondary | ICD-10-CM | POA: Diagnosis not present

## 2022-09-03 DIAGNOSIS — E118 Type 2 diabetes mellitus with unspecified complications: Secondary | ICD-10-CM | POA: Diagnosis not present

## 2022-09-07 DIAGNOSIS — G8929 Other chronic pain: Secondary | ICD-10-CM | POA: Diagnosis not present

## 2022-09-07 DIAGNOSIS — M25561 Pain in right knee: Secondary | ICD-10-CM | POA: Diagnosis not present

## 2022-09-07 DIAGNOSIS — M5416 Radiculopathy, lumbar region: Secondary | ICD-10-CM | POA: Diagnosis not present

## 2022-09-28 DIAGNOSIS — E785 Hyperlipidemia, unspecified: Secondary | ICD-10-CM | POA: Diagnosis not present

## 2022-09-28 DIAGNOSIS — I10 Essential (primary) hypertension: Secondary | ICD-10-CM | POA: Diagnosis not present

## 2022-09-28 DIAGNOSIS — Z0000 Encounter for general adult medical examination without abnormal findings: Secondary | ICD-10-CM | POA: Diagnosis not present

## 2022-09-28 DIAGNOSIS — E1143 Type 2 diabetes mellitus with diabetic autonomic (poly)neuropathy: Secondary | ICD-10-CM | POA: Diagnosis not present

## 2022-09-28 DIAGNOSIS — G4730 Sleep apnea, unspecified: Secondary | ICD-10-CM | POA: Diagnosis not present

## 2022-10-01 DIAGNOSIS — E1143 Type 2 diabetes mellitus with diabetic autonomic (poly)neuropathy: Secondary | ICD-10-CM | POA: Diagnosis not present

## 2022-10-01 DIAGNOSIS — I10 Essential (primary) hypertension: Secondary | ICD-10-CM | POA: Diagnosis not present

## 2022-10-01 DIAGNOSIS — Z0000 Encounter for general adult medical examination without abnormal findings: Secondary | ICD-10-CM | POA: Diagnosis not present

## 2022-10-04 DIAGNOSIS — G4733 Obstructive sleep apnea (adult) (pediatric): Secondary | ICD-10-CM | POA: Diagnosis not present

## 2022-10-21 ENCOUNTER — Other Ambulatory Visit: Payer: Self-pay

## 2022-10-21 ENCOUNTER — Emergency Department (HOSPITAL_COMMUNITY)
Admission: EM | Admit: 2022-10-21 | Discharge: 2022-10-21 | Disposition: A | Discharge status: Home / Self Care | Payer: Medicare Other | Attending: Student | Admitting: Student

## 2022-10-21 ENCOUNTER — Encounter (HOSPITAL_COMMUNITY): Payer: Self-pay

## 2022-10-21 ENCOUNTER — Emergency Department (HOSPITAL_COMMUNITY): Payer: Medicare Other

## 2022-10-21 DIAGNOSIS — Z87891 Personal history of nicotine dependence: Secondary | ICD-10-CM | POA: Insufficient documentation

## 2022-10-21 DIAGNOSIS — K439 Ventral hernia without obstruction or gangrene: Secondary | ICD-10-CM | POA: Diagnosis not present

## 2022-10-21 DIAGNOSIS — Z7982 Long term (current) use of aspirin: Secondary | ICD-10-CM | POA: Diagnosis not present

## 2022-10-21 DIAGNOSIS — K625 Hemorrhage of anus and rectum: Secondary | ICD-10-CM

## 2022-10-21 DIAGNOSIS — I2510 Atherosclerotic heart disease of native coronary artery without angina pectoris: Secondary | ICD-10-CM | POA: Diagnosis not present

## 2022-10-21 DIAGNOSIS — Z7984 Long term (current) use of oral hypoglycemic drugs: Secondary | ICD-10-CM | POA: Insufficient documentation

## 2022-10-21 DIAGNOSIS — R109 Unspecified abdominal pain: Secondary | ICD-10-CM | POA: Diagnosis not present

## 2022-10-21 DIAGNOSIS — R197 Diarrhea, unspecified: Secondary | ICD-10-CM | POA: Diagnosis not present

## 2022-10-21 DIAGNOSIS — Q272 Other congenital malformations of renal artery: Secondary | ICD-10-CM | POA: Diagnosis not present

## 2022-10-21 DIAGNOSIS — E119 Type 2 diabetes mellitus without complications: Secondary | ICD-10-CM | POA: Diagnosis not present

## 2022-10-21 DIAGNOSIS — I10 Essential (primary) hypertension: Secondary | ICD-10-CM | POA: Diagnosis not present

## 2022-10-21 DIAGNOSIS — R1032 Left lower quadrant pain: Secondary | ICD-10-CM | POA: Diagnosis not present

## 2022-10-21 DIAGNOSIS — Z79899 Other long term (current) drug therapy: Secondary | ICD-10-CM | POA: Diagnosis not present

## 2022-10-21 HISTORY — DX: Irritable bowel syndrome, unspecified: K58.9

## 2022-10-21 LAB — CBC WITH DIFFERENTIAL/PLATELET
Abs Immature Granulocytes: 0.07 10*3/uL (ref 0.00–0.07)
Basophils Absolute: 0 10*3/uL (ref 0.0–0.1)
Basophils Relative: 0 %
Eosinophils Absolute: 0 10*3/uL (ref 0.0–0.5)
Eosinophils Relative: 0 %
HCT: 39.8 % (ref 36.0–46.0)
Hemoglobin: 13.6 g/dL (ref 12.0–15.0)
Immature Granulocytes: 1 %
Lymphocytes Relative: 8 %
Lymphs Abs: 1.2 10*3/uL (ref 0.7–4.0)
MCH: 31 pg (ref 26.0–34.0)
MCHC: 34.2 g/dL (ref 30.0–36.0)
MCV: 90.7 fL (ref 80.0–100.0)
Monocytes Absolute: 0.8 10*3/uL (ref 0.1–1.0)
Monocytes Relative: 5 %
Neutro Abs: 13 10*3/uL — ABNORMAL HIGH (ref 1.7–7.7)
Neutrophils Relative %: 86 %
Platelets: 271 10*3/uL (ref 150–400)
RBC: 4.39 MIL/uL (ref 3.87–5.11)
RDW: 13.1 % (ref 11.5–15.5)
WBC: 15 10*3/uL — ABNORMAL HIGH (ref 4.0–10.5)
nRBC: 0 % (ref 0.0–0.2)

## 2022-10-21 LAB — URINALYSIS, ROUTINE W REFLEX MICROSCOPIC
Bacteria, UA: NONE SEEN
Bilirubin Urine: NEGATIVE
Glucose, UA: >=500 mg/dL — AB
Hgb urine dipstick: NEGATIVE
Ketones, ur: 5 mg/dL — AB
Leukocytes,Ua: NEGATIVE
Nitrite: NEGATIVE
Protein, ur: NEGATIVE mg/dL
Specific Gravity, Urine: 1.02 (ref 1.005–1.030)
pH: 6 (ref 5.0–8.0)

## 2022-10-21 LAB — COMPREHENSIVE METABOLIC PANEL
ALT: 19 U/L (ref 0–44)
AST: 19 U/L (ref 15–41)
Albumin: 4.3 g/dL (ref 3.5–5.0)
Alkaline Phosphatase: 69 U/L (ref 38–126)
Anion gap: 13 (ref 5–15)
BUN: 23 mg/dL (ref 8–23)
CO2: 23 mmol/L (ref 22–32)
Calcium: 9.3 mg/dL (ref 8.9–10.3)
Chloride: 93 mmol/L — ABNORMAL LOW (ref 98–111)
Creatinine, Ser: 0.66 mg/dL (ref 0.44–1.00)
GFR, Estimated: >60 mL/min (ref >60)
Glucose, Bld: 186 mg/dL — ABNORMAL HIGH (ref 70–99)
Potassium: 4.1 mmol/L (ref 3.5–5.1)
Sodium: 129 mmol/L — ABNORMAL LOW (ref 135–145)
Total Bilirubin: 0.9 mg/dL (ref 0.3–1.2)
Total Protein: 7.5 g/dL (ref 6.5–8.1)

## 2022-10-21 LAB — LIPASE, BLOOD: Lipase: 27 U/L (ref 11–51)

## 2022-10-21 MED ORDER — ONDANSETRON HCL 4 MG/2ML IJ SOLN
4.0000 mg | Freq: Once | INTRAMUSCULAR | Status: AC
  Administered 2022-10-21: 4 mg via INTRAVENOUS
  Filled 2022-10-21: qty 2

## 2022-10-21 MED ORDER — MORPHINE SULFATE (PF) 4 MG/ML IV SOLN: 4.0000 mg | Freq: Once | INTRAVENOUS | Status: DC

## 2022-10-21 MED ORDER — PANTOPRAZOLE SODIUM 20 MG PO TBEC: 20.0000 mg | DELAYED_RELEASE_TABLET | Freq: Every day | ORAL | 0 refills | Status: AC

## 2022-10-21 MED ORDER — IOHEXOL 350 MG/ML SOLN
100.0000 mL | Freq: Once | INTRAVENOUS | Status: AC | PRN
  Administered 2022-10-21: 100 mL via INTRAVENOUS

## 2022-10-21 MED ORDER — MORPHINE SULFATE (PF) 2 MG/ML IV SOLN
INTRAVENOUS | Status: AC
  Administered 2022-10-21: 4 mg via INTRAVENOUS
  Filled 2022-10-21: qty 2

## 2022-10-21 MED ORDER — LACTATED RINGERS IV BOLUS
1000.0000 mL | Freq: Once | INTRAVENOUS | Status: AC
  Administered 2022-10-21: 1000 mL via INTRAVENOUS

## 2022-10-21 MED ORDER — DICYCLOMINE HCL 10 MG PO CAPS
20.0000 mg | ORAL_CAPSULE | Freq: Once | ORAL | Status: AC
  Administered 2022-10-21: 20 mg via ORAL
  Filled 2022-10-21: qty 2

## 2022-10-21 MED ORDER — DICYCLOMINE HCL 20 MG PO TABS: 20.0000 mg | ORAL_TABLET | Freq: Two times a day (BID) | ORAL | 0 refills | Status: DC

## 2022-10-21 NOTE — ED Triage Notes (Signed)
Pt arrived today via POV from home c/o abdominal pain and diarrhea. Pt reports her PCP started her on a new medication, Jardiance, and endorses Hx of IBS. Pt reports multiple episodes of emesis and also endorses hematochezia.

## 2022-10-21 NOTE — ED Notes (Signed)
Pt in CT at this time.

## 2022-10-22 NOTE — ED Provider Notes (Signed)
[place] EMERGENCY DEPARTMENT AT [facility] Provider Note  CSN: [id number] Arrival date & time: 10/21/22 2032  Chief Complaint(s) Abdominal Pain  HPI Andrea Rios is a 72 y.o. female with PMH IBS, fibromyalgia, HTN, previous CVA, hemorrhoids who presents to the emergency department for evaluation of abdominal pain and bright red blood per rectum.  Patient states that she recently started Jardiance and has since had severe gastrointestinal issues with crampy abdominal pain, frequent urination and a feeling of dehydration.  She had a single episode of bright red blood per rectum today and came to the emergency department for further evaluation.  Here she endorses left lower quadrant abdominal pain but denies nausea, vomiting, chest pain, shortness of breath or other systemic symptoms.  Does not take blood thinners.   Past Medical History Past Medical History:  Diagnosis Date   Asthma    Back pain    Cerebrovascular disease    Constipation    Diabetes (HCC)    Fibromyalgia    H/O colonoscopy 04/12/2014   polp, diverticulosis, Dr. Teena Dunk   H/O mammogram 11/2014   normal   HTN (hypertension)    Hyperlipidemia    IBS (irritable bowel syndrome)    Lumbago    Obesity    Paronychia of toe    Snoring    Stroke [facility]) 2003   Patient Active Problem List   Diagnosis Date Noted   Impingement syndrome of left shoulder 01/14/2022   Abnormal cardiovascular stress test 09/16/2021   Coronary artery disease of native artery of native heart with stable angina pectoris (HCC) 09/16/2021   Dyslipidemia associated with type 2 diabetes mellitus (HCC) 09/16/2021   Essential (primary) hypertension 09/16/2021   Morbid obesity (HCC) 09/16/2021   Suspected sleep apnea 09/16/2021   Precordial pain 10/09/2012   Home Medication(s) Prior to Admission medications   Medication Sig Start Date End Date Taking? Authorizing Provider  aspirin 81 MG chewable tablet Chew 81 mg by mouth daily.   Yes  [provider]  cefUROXime (CEFTIN) 250 MG tablet Take 250 mg by mouth 2 (two) times daily. 05/03/22  Yes [provider]  cyclobenzaprine (FLEXERIL) 5 MG tablet Take 5 mg by mouth once. In the morning 07/08/22  Yes [provider]  dicyclomine (BENTYL) 20 MG tablet Take 1 tablet (20 mg total) by mouth 2 (two) times daily. 10/21/22  Yes Kush Farabee, MD  empagliflozin (JARDIANCE) 25 MG TABS tablet Take 25 mg by mouth daily.   Yes [provider]  gabapentin (NEURONTIN) 100 MG capsule Take 100 mg by mouth at bedtime. 12/03/19  Yes [provider]  glimepiride (AMARYL) 2 MG tablet Take 2 mg by mouth daily before breakfast.   Yes [provider]  glimepiride (AMARYL) 2 MG tablet Take by mouth. 12/03/19  Yes [provider]  ketorolac (ACULAR) 0.5 % ophthalmic solution Place 1 drop into the right eye 4 (four) times daily. 06/22/22  Yes [provider]  lisinopril (PRINIVIL,ZESTRIL) 40 MG tablet Take 40 mg by mouth daily.   Yes [provider]  lisinopril (ZESTRIL) 40 MG tablet Take 1 tablet by mouth daily. 11/07/19  Yes [provider]  metFORMIN (GLUCOPHAGE) 500 MG tablet Take 1 tablet (500 mg total) by mouth 2 (two) times daily with a meal. HOLD for 48 hours, restart on 10/12/2012. 12/04/12  Yes Barrett, Joline Salt, PA-C  metFORMIN (GLUCOPHAGE) 500 MG tablet Take by mouth. 11/07/19  Yes [provider]  metoprolol succinate (TOPROL-XL) 50  MG 24 hr tablet Take 50 mg by mouth daily at 2 PM. 06/15/21  Yes [provider]  montelukast (SINGULAIR) 10 MG tablet Take 10 mg by mouth daily. 11/27/18  Yes [provider]  montelukast (SINGULAIR) 10 MG tablet Take 1 tablet by mouth daily. 10/21/19  Yes [provider]  pantoprazole (PROTONIX) 20 MG tablet Take 1 tablet (20 mg total) by mouth daily. 10/21/22  Yes Cerinity Zynda, MD  prednisoLONE acetate (PRED FORTE) 1 % ophthalmic suspension Place into  the right eye. 07/16/22  Yes [provider]  predniSONE (DELTASONE) 5 MG tablet Take 5 mg by mouth daily. 05/18/22  Yes [provider]  rosuvastatin (CRESTOR) 5 MG tablet Take 5 mg by mouth daily. 11/27/18  Yes [provider]  rosuvastatin (CRESTOR) 5 MG tablet Take by mouth. 12/03/19  Yes [provider]  sertraline (ZOLOFT) 25 MG tablet Take 1 tablet (25 mg total) by mouth daily. 08/07/21  Yes Jaffe, Adam R, DO  albuterol (PROVENTIL) (2.5 MG/3ML) 0.083% nebulizer solution Take 2.5 mg by nebulization every 6 (six) hours as needed for wheezing.    [provider]  albuterol (PROVENTIL) (2.5 MG/3ML) 0.083% nebulizer solution Inhale into the lungs.    [provider]  aspirin 81 MG chewable tablet Chew by mouth.    [provider]  Benzyl Alcohol 5 % LOTN Apply topically as needed.    [provider]  budesonide-formoterol (SYMBICORT) 160-4.5 MCG/ACT inhaler Inhale 2 puffs into the lungs 2 (two) times daily as needed.     [provider]  budesonide-formoterol (SYMBICORT) 160-4.5 MCG/ACT inhaler Inhale into the lungs.    [provider]  hydrochlorothiazide (HYDRODIURIL) 25 MG tablet Take 1 tablet (25 mg total) by mouth daily. 04/23/16 07/22/16  Laqueta Linden, MD  hydrochlorothiazide (HYDRODIURIL) 50 MG tablet Take 50 mg by mouth daily.    [provider]  LINZESS 72 MCG capsule Take by mouth. 01/01/22   [provider]  metoprolol succinate (TOPROL-XL) 50 MG 24 hr tablet Take 1 tablet by mouth daily.    [provider]  nitroGLYCERIN (NITROSTAT) 0.4 MG SL tablet Place 0.4 mg under the tongue every 5 (five) minutes as needed for chest pain.    [provider]  Respiratory Therapy Supplies (CARETOUCH 2 CPAP HOSE HANGER) MISC by Does not apply route.    [provider]                                                                                                                                     Past Surgical History Past Surgical History:  Procedure Laterality Date   GALLBLADDER SURGERY     HERNIA REPAIR     x3   LEFT HEART CATHETERIZATION WITH CORONARY ANGIOGRAM N/A 10/09/2012   Procedure: LEFT HEART CATHETERIZATION WITH CORONARY ANGIOGRAM;  Surgeon: Peter M Swaziland, MD;  Location: [facility] CATH LAB;  Service:  Cardiovascular;  Laterality: N/A;   TONSILLECTOMY AND ADENOIDECTOMY     Family History Family History  Problem Relation Age of Onset   Heart failure Mother    Heart failure Father    Parkinson's disease Sister    Lung cancer Sister     Social History Social History   Tobacco Use   Smoking status: Former    Packs/day: 2.00    Years: 12.00    Additional pack years: 0.00    Total pack years: 24.00    Types: Cigarettes    Start date: 06/19/1965    Quit date: 04/26/1977    Years since quitting: 45.5    Passive exposure: Never   Smokeless tobacco: Never  Vaping Use   Vaping Use: Never used  Substance Use Topics   Alcohol use: Not Currently    Comment: occasional   Drug use: Never   Allergies Prednisone, Sulfa antibiotics, and Zocor [simvastatin]  Review of Systems Review of Systems  Gastrointestinal:  Positive for abdominal pain and blood in stool.    Physical Exam Vital Signs  I have reviewed the triage vital signs BP (!) 152/64   Pulse 65   Temp 98.1 F (36.7 C)   Resp 16   Ht 5' 2.5" (1.588 m)   Wt 106.6 kg   SpO2 96%   BMI 42.30 kg/m   Physical Exam Vitals and nursing note reviewed.  Constitutional:      General: She is not in acute distress.    Appearance: She is well-developed.  HENT:     Head: Normocephalic and atraumatic.  Eyes:     Conjunctiva/sclera: Conjunctivae normal.  Cardiovascular:     Rate and Rhythm: Normal rate and regular rhythm.     Heart sounds: No murmur heard. Pulmonary:     Effort: Pulmonary effort is normal. No respiratory distress.     Breath sounds: Normal breath sounds.  Abdominal:      Palpations: Abdomen is soft.     Tenderness: There is abdominal tenderness in the suprapubic area and left lower quadrant.  Musculoskeletal:        General: No swelling.     Cervical back: Neck supple.  Skin:    General: Skin is warm and dry.     Capillary Refill: Capillary refill takes less than 2 seconds.  Neurological:     Mental Status: She is alert.  Psychiatric:        Mood and Affect: Mood normal.     ED Results and Treatments Labs (all labs ordered are listed, but only abnormal results are displayed) Labs Reviewed  COMPREHENSIVE METABOLIC PANEL - Abnormal; Notable for the following components:      Result Value   Sodium 129 (*)    Chloride 93 (*)    Glucose, Bld 186 (*)    All other components within normal limits  CBC WITH DIFFERENTIAL/PLATELET - Abnormal; Notable for the following components:   WBC 15.0 (*)    Neutro Abs 13.0 (*)    All other components within normal limits  URINALYSIS, ROUTINE W REFLEX MICROSCOPIC - Abnormal; Notable for the following components:   Glucose, UA >=500 (*)    Ketones, ur 5 (*)    All other components within normal limits  LIPASE, BLOOD  Radiology CT ANGIO GI BLEED  Result Date: 10/21/2022 CLINICAL DATA:  Lower gastrointestinal hemorrhage, abdominal pain, diarrhea EXAM: CTA ABDOMEN AND PELVIS WITHOUT AND WITH CONTRAST TECHNIQUE: Multidetector CT imaging of the abdomen and pelvis was performed using the standard protocol during bolus administration of intravenous contrast. Multiplanar reconstructed images and MIPs were obtained and reviewed to evaluate the vascular anatomy. RADIATION DOSE REDUCTION: This exam was performed according to the departmental dose-optimization program which includes automated exposure control, adjustment of the mA and/or kV according to patient size and/or use of iterative reconstruction  technique. CONTRAST:  OMNIPAQUE IOHEXOL 350 MG/ML SOLN COMPARISON:  None Available. FINDINGS: VASCULAR Aorta: Normal caliber aorta without aneurysm, dissection, vasculitis or significant stenosis. Mild atherosclerotic calcification. Celiac: Patent without evidence of aneurysm, dissection, vasculitis or significant stenosis. SMA: Patent without evidence of aneurysm, dissection, vasculitis or significant stenosis. Renals: Both renal arteries are patent without evidence of aneurysm, dissection, vasculitis, fibromuscular dysplasia or significant stenosis. Tiny accessory left lower pole renal artery noted. IMA: Patent without evidence of aneurysm, dissection, vasculitis or significant stenosis. Inflow: Patent without evidence of aneurysm, dissection, vasculitis or significant stenosis. Proximal Outflow: Bilateral common femoral and visualized portions of the superficial and profunda femoral arteries are patent without evidence of aneurysm, dissection, vasculitis or significant stenosis. Veins: No obvious venous abnormality within the limitations of this arterial phase study. Review of the MIP images confirms the above findings. NON-VASCULAR Lower chest: No acute abnormality. Hepatobiliary: No focal liver abnormality is seen. Status post cholecystectomy. No biliary dilatation. Pancreas: Unremarkable Spleen: Unremarkable Adrenals/Urinary Tract: Adrenal glands are unremarkable. Kidneys are normal, without renal calculi, focal lesion, or hydronephrosis. Bladder is unremarkable. Stomach/Bowel: Large left lateral abdominal wall hernia is present containing and unremarkable portion of the descending and sigmoid colon. The stomach, small bowel, and large bowel are otherwise unremarkable. No evidence of obstruction or focal inflammation. Appendix normal. No free intraperitoneal gas or fluid. No active gastrointestinal hemorrhage identified. Lymphatic: No pathologic adenopathy within the abdomen and pelvis. Reproductive:  Uterus and bilateral adnexa are unremarkable. Other: Ventral hernia repair with mesh has been performed. Musculoskeletal: No acute bone abnormality. No lytic or blastic bone lesion. Degenerative changes are seen within the lumbar spine. IMPRESSION: 1. No active gastrointestinal hemorrhage identified. No acute intra-abdominal pathology identified. 2. Large left lateral abdominal wall hernia containing unremarkable portion of the descending and sigmoid colon. No evidence of obstruction. Aortic Atherosclerosis (ICD10-I70.0). Electronically Signed   By: Helyn Numbers M.D.   On: 10/21/2022 23:22    Pertinent labs & imaging results that were available during my care of the patient were reviewed by me and considered in my medical decision making (see MDM for details).  Medications Ordered in ED Medications  morphine (PF) 4 MG/ML injection 4 mg (4 mg Intravenous Not Given 10/21/22 2233)  ondansetron (ZOFRAN) injection 4 mg (4 mg Intravenous Given 10/21/22 2210)  lactated ringers bolus 1,000 mL (0 mLs Intravenous Stopped 10/21/22 2327)  morphine (PF) 2 MG/ML injection (4 mg Intravenous Given 10/21/22 2211)  iohexol (OMNIPAQUE) 350 MG/ML injection 100 mL (100 mLs Intravenous Contrast Given 10/21/22 2232)  dicyclomine (BENTYL) capsule 20 mg (20 mg Oral Given 10/21/22 2344)  Procedures Procedures  (including critical care time)  Medical Decision Making / ED Course   This patient presents to the ED for concern of abdominal pain, bright red blood per rectum, this involves an extensive number of treatment options, and is a complaint that carries with it a high risk of complications and morbidity.  The differential diagnosis includes IBD flare, enteritis, infectious diarrhea, abdominal malignancy, anal fissure, internal hemorrhoid, external hemorrhoid, diverticular bleed,  hemangiodysplasia, retained foreign body  MDM: Patient seen emergency room for evaluation of bright red blood per rectum and abdominal pain.  Physical exam with left lower quadrant tenderness to palpation but is otherwise unremarkable.  Laboratory evaluation with a mild hyponatremia to 129, leukocytosis to 15.0 but hemoglobin is stable at 13.6, urinalysis unremarkable.  Lipase negative.  CT angio GI bleed study obtained that was reassuringly negative other than a large ventral hernia but no evidence of incarceration or obstruction.  Symptoms improved in the emergency room and she will be given Bentyl for her abdominal cramping.  She was advised to stop taking her Jardiance as this appears to be causing a significant amount of gastrointestinal side effects with the patient and I did place an outpatient referral to gastroenterology for her single episode of bright red blood per rectum.  Suspect internal hemorrhoids.  Patient started on PPI, symptoms improved and overall reassuring workup she does not meet inpatient criteria for admission and she will be discharged with outpatient follow-up and return precautions of which she voiced understanding.  Additional history obtained: -Additional history obtained from son -External records from outside source obtained and reviewed including: Chart review including previous notes, labs, imaging, consultation notes   Lab Tests: -I ordered, reviewed, and interpreted labs.   The pertinent results include:   Labs Reviewed  COMPREHENSIVE METABOLIC PANEL - Abnormal; Notable for the following components:      Result Value   Sodium 129 (*)    Chloride 93 (*)    Glucose, Bld 186 (*)    All other components within normal limits  CBC WITH DIFFERENTIAL/PLATELET - Abnormal; Notable for the following components:   WBC 15.0 (*)    Neutro Abs 13.0 (*)    All other components within normal limits  URINALYSIS, ROUTINE W REFLEX MICROSCOPIC - Abnormal; Notable for the  following components:   Glucose, UA >=500 (*)    Ketones, ur 5 (*)    All other components within normal limits  LIPASE, BLOOD      EKG   EKG Interpretation Date/Time:  Thursday October 21 2022 21:38:40 EDT Ventricular Rate:  61 PR Interval:  175 QRS Duration:  102 QT Interval:  436 QTC Calculation: 440 R Axis:   63  Text Interpretation: Sinus rhythm Posterior infarct, old Abnrm T, consider ischemia, anterolateral lds Confirmed by Dejion Grillo ([id number]) on 10/21/2022 9:48:39 PM         Imaging Studies ordered: I ordered imaging studies including CT angio GI bleed I independently visualized and interpreted imaging. I agree with the radiologist interpretation   Medicines ordered and prescription drug management: Meds ordered this encounter  Medications   morphine (PF) 4 MG/ML injection 4 mg   ondansetron (ZOFRAN) injection 4 mg   lactated ringers bolus 1,000 mL   morphine (PF) 2 MG/ML injection    [place] Score F: cabinet override   iohexol (OMNIPAQUE) 350 MG/ML injection 100 mL   dicyclomine (BENTYL) capsule 20 mg   pantoprazole (PROTONIX) 20 MG tablet    Sig: Take 1 tablet (  20 mg total) by mouth daily.    Dispense:  30 tablet    Refill:  0   dicyclomine (BENTYL) 20 MG tablet    Sig: Take 1 tablet (20 mg total) by mouth 2 (two) times daily.    Dispense:  20 tablet    Refill:  0    -I have reviewed the patients home medicines and have made adjustments as needed  Critical interventions none    Cardiac Monitoring: The patient was maintained on a cardiac monitor.  I personally viewed and interpreted the cardiac monitored which showed an underlying rhythm of: NSR  Social Determinants of Health:  Factors impacting patients care include: none   Reevaluation: After the interventions noted above, I reevaluated the patient and found that they have :improved  Co morbidities that complicate the patient evaluation  Past Medical History:  Diagnosis Date   Asthma     Back pain    Cerebrovascular disease    Constipation    Diabetes (HCC)    Fibromyalgia    H/O colonoscopy 04/12/2014   polp, diverticulosis, Dr. Teena Dunk   H/O mammogram 11/2014   normal   HTN (hypertension)    Hyperlipidemia    IBS (irritable bowel syndrome)    Lumbago    Obesity    Paronychia of toe    Snoring    Stroke [facility]) 2003      Dispostion: I considered admission for this patient, but at this time he does not meet inpatient criteria for admission and she is safe for discharge with outpatient follow-up     Final Clinical Impression(s) / ED Diagnoses Final diagnoses:  Rectal bleeding  Abdominal pain, unspecified abdominal location     @PCDICTATION @    Glendora Score, MD 10/22/22 [phone]

## 2022-11-03 DIAGNOSIS — G4733 Obstructive sleep apnea (adult) (pediatric): Secondary | ICD-10-CM | POA: Diagnosis not present

## 2022-11-12 ENCOUNTER — Encounter: Payer: Self-pay | Admitting: Gastroenterology

## 2022-11-12 ENCOUNTER — Ambulatory Visit (INDEPENDENT_AMBULATORY_CARE_PROVIDER_SITE_OTHER): Payer: Medicare Other | Admitting: Gastroenterology

## 2022-11-12 VITALS — BP 129/70 | HR 73 | Temp 98.9°F | Ht 63.0 in | Wt 240.0 lb

## 2022-11-12 DIAGNOSIS — K625 Hemorrhage of anus and rectum: Secondary | ICD-10-CM

## 2022-11-12 DIAGNOSIS — Z86010 Personal history of colonic polyps: Secondary | ICD-10-CM | POA: Diagnosis not present

## 2022-11-12 DIAGNOSIS — K5900 Constipation, unspecified: Secondary | ICD-10-CM | POA: Diagnosis not present

## 2022-11-12 MED ORDER — LUBIPROSTONE 24 MCG PO CAPS: 24.0000 ug | ORAL_CAPSULE | Freq: Two times a day (BID) | ORAL | 3 refills | Status: DC

## 2022-11-12 NOTE — Progress Notes (Signed)
GI Office Note    Referring Provider: Glendora Score, MD Primary Care Physician:  Toma Deiters, MD  Primary Gastroenterologist: Hennie Duos. Marletta Lor, DO   Chief Complaint   Chief Complaint  Patient presents with   New Patient (Initial Visit)    Pt stated she was referred for rectal bleeding but it only happened once. She has had no further bleeding.      History of Present Illness   Andrea Rios is a 72 y.o. female presenting today at the request of the EDP. Recently seen in the ED for left lower abdominal pain, diarrhea, vomiting, rectal bleeding which started about a week after starting Jardiance.  In the ED her Hgb was 13.6, WBC 15,000. CTA GI bleeding scan showed no active GI bleeding. She has a large left lateral abdominal wall hernia containing unremarkable portion of descending and sigmoid colon. No evidence of obstruction.   Patient reports chronic constipation. In the past she did well with Linzess but could not afford the RX, $80 per month. She has never tried Miralax. Typically she will use a Fleet's enema and then reuse the bottle to give tap water enemas at least 3-4 times before throwing out and starting with new Fleet's enema bottle. She may be getting a Fleet's 2-3 times per week and the rest of the time uses tap water. She typically only passes balls of stool and does not feel like she is getting good movements. Sometimes she also takes magnesium citrate. Eats about 2 servings of fruit/veggies per day.  She has had no further n/v/d since in the ED. No abdominal pain. Has infrequent heartburn for which she takes OTC antacids. She is not sure she needs pantoprazole or bentyl given by EDP. She is no longer taken Jardiance. Has to see PCP for other options. She denies further rectal bleeding. The day in the ED, she passed mostly fresh blood but some darker maroon color only once.      H/o precancerous colon plyps on the colonoscpy by Dr. Linna Darner per patient. But she  cannot recall findings of her last colonoscopy with Dr. Teena Dunk.   She has large left abdominal hernia.   Reviewed path records on EPIC: she had tubulovillous adenoma of colon in 2003. And tubular adenoma in 2015.  Medications   Current Outpatient Medications  Medication Sig Dispense Refill   aspirin 81 MG chewable tablet Chew 81 mg by mouth daily.     budesonide-formoterol (SYMBICORT) 160-4.5 MCG/ACT inhaler Inhale 2 puffs into the lungs 2 (two) times daily as needed.      cyclobenzaprine (FLEXERIL) 5 MG tablet Take 5 mg by mouth once. In the morning     Fish Oil-Cholecalciferol (FISH OIL + D3 PO) Take 3 tablets by mouth daily.     gabapentin (NEURONTIN) 100 MG capsule Take 100 mg by mouth at bedtime.     glimepiride (AMARYL) 2 MG tablet Take 2 mg by mouth daily before breakfast.     hydrochlorothiazide (HYDRODIURIL) 50 MG tablet Take 50 mg by mouth daily.     ketorolac (ACULAR) 0.5 % ophthalmic solution Place 1 drop into the right eye 4 (four) times daily.     lisinopril (PRINIVIL,ZESTRIL) 40 MG tablet Take 40 mg by mouth daily.     metFORMIN (GLUCOPHAGE) 500 MG tablet Take 500 mg by mouth 3 (three) times daily before meals.     metoprolol succinate (TOPROL-XL) 50 MG 24 hr tablet Take 1 tablet by mouth daily.  montelukast (SINGULAIR) 10 MG tablet Take 1 tablet by mouth daily.     nitroGLYCERIN (NITROSTAT) 0.4 MG SL tablet Place 0.4 mg under the tongue every 5 (five) minutes as needed for chest pain.     pantoprazole (PROTONIX) 20 MG tablet Take 1 tablet (20 mg total) by mouth daily. 30 tablet 0   Respiratory Therapy Supplies (CARETOUCH 2 CPAP HOSE HANGER) MISC by Does not apply route.     rosuvastatin (CRESTOR) 5 MG tablet Take by mouth.     albuterol (PROVENTIL) (2.5 MG/3ML) 0.083% nebulizer solution Inhale into the lungs. (Patient not taking: Reported on 11/12/2022)     montelukast (SINGULAIR) 10 MG tablet Take 10 mg by mouth daily. (Patient not taking: Reported on 11/12/2022)     No  current facility-administered medications for this visit.    Allergies   Allergies as of 11/12/2022 - Review Complete 11/12/2022  Allergen Reaction Noted   Prednisone Hives and Swelling 10/09/2012   Sulfa antibiotics Hives 10/09/2012   Zocor [simvastatin]  03/11/2016    Past Medical History   Past Medical History:  Diagnosis Date   Asthma    Back pain    Cerebrovascular disease    Constipation    Diabetes (HCC)    Fibromyalgia    H/O colonoscopy 04/12/2014   polp, diverticulosis, Dr. Teena Dunk   H/O mammogram 11/2014   normal   HTN (hypertension)    Hyperlipidemia    IBS (irritable bowel syndrome)    Lumbago    Obesity    Paronychia of toe    Snoring    Stroke [facility]) 2003    Past Surgical History   Past Surgical History:  Procedure Laterality Date   GALLBLADDER SURGERY     HERNIA REPAIR     x3   LEFT HEART CATHETERIZATION WITH CORONARY ANGIOGRAM N/A 10/09/2012   Procedure: LEFT HEART CATHETERIZATION WITH CORONARY ANGIOGRAM;  Surgeon: Peter M Swaziland, MD;  Location: [facility] CATH LAB;  Service: Cardiovascular;  Laterality: N/A;   TONSILLECTOMY AND ADENOIDECTOMY      Past Family History   Family History  Problem Relation Age of Onset   Heart failure Mother    Heart failure Father    Parkinson's disease Sister    Lung cancer Sister     Past Social History   Social History   Socioeconomic History   Marital status: Widowed    Spouse name: Not on file   Number of children: 2   Years of education: Not on file   Highest education level: High school graduate  Occupational History   Not on file  Tobacco Use   Smoking status: Former    Current packs/day: 0.00    Average packs/day: 2.0 packs/day for 12.0 years (23.9 ttl pk-yrs)    Types: Cigarettes    Start date: 06/19/1965    Quit date: 04/26/1977    Years since quitting: 45.5    Passive exposure: Never   Smokeless tobacco: Never  Vaping Use   Vaping status: Never Used  Substance and Sexual Activity   Alcohol  use: Not Currently    Comment: occasional   Drug use: Never   Sexual activity: Not Currently  Other Topics Concern   Not on file  Social History Narrative   [place] ranch, R handed, lives with son and granddaughter, caffeine - 1 cup coffee am; used to work as Lawyer ; few months of college      [employer] witness - NO BLOOD PRODUCTS   Social Determinants  of Health   Financial Resource Strain: Not on file  Food Insecurity: Not on file  Transportation Needs: Not on file  Physical Activity: Inactive (12/06/2019)   [facility], [facility]   Exercise Vital Sign    Days of Exercise per Week: 0 days    Minutes of Exercise per Session: 0 min  Stress: Not on file  Social Connections: Unknown (07/26/2022)   Received from [facility], [facility]   Social Network    Social Network: Not on file  Intimate Partner Violence: Unknown (07/26/2022)   Received from [facility], [facility]   HITS    Physically Hurt: Not on file    Insult or Talk Down To: Not on file    Threaten Physical Harm: Not on file    Scream or Curse: Not on file    Review of Systems   General: Negative for anorexia, weight loss, fever, chills, fatigue, weakness. Ambulates with walker Eyes: Negative for vision changes.  ENT: Negative for hoarseness, difficulty swallowing , nasal congestion. CV: Negative for chest pain, angina, palpitations, +dyspnea on exertion, +peripheral edema.  Respiratory: Negative for dyspnea at rest, +dyspnea on exertion. No cough, sputum, wheezing.  GI: See history of present illness. GU:  Negative for dysuria, hematuria, urinary incontinence, urinary frequency, nocturnal urination.  MS: Negative for joint pain, low back pain.  Derm: Negative for rash or itching.  Neuro: Negative for weakness, abnormal sensation, seizure, frequent headaches, memory loss,  confusion.  Psych: Negative for anxiety, depression, suicidal ideation, hallucinations.  Endo: Negative for  unusual weight change.  Heme: Negative for bruising or bleeding. Allergy: Negative for rash or hives.  Physical Exam   BP 129/70   Pulse 73   Temp 98.9 F (37.2 C)   Ht 5\' 3"  (1.6 m)   Wt 240 lb (108.9 kg)   BMI 42.51 kg/m    General: Well-nourished, well-developed in no acute distress.  Head: Normocephalic, atraumatic.   Eyes: Conjunctiva pink, no icterus. Mouth: Oropharyngeal mucosa moist and pink , no lesions erythema or exudate. Neck: Supple without thyromegaly, masses, or lymphadenopathy.  Lungs: Clear to auscultation bilaterally.  Heart: Regular rate and rhythm, no murmurs rubs or gallops.  Abdomen: Bowel sounds are normal, nontender, nondistended, no hepatosplenomegaly or masses,  no abdominal bruits, no rebound or guarding.  Large lateral wall hernia. Diastasis recti. Rectal: not performed Extremities: No lower extremity edema. No clubbing or deformities.  Neuro: Alert and oriented x 4 , grossly normal neurologically.  Skin: Warm and dry, no rash or jaundice.   Psych: Alert and cooperative, normal mood and affect.  Labs   Lab Results  Component Value Date   NA 129 (L) 10/21/2022   CL 93 (L) 10/21/2022   K 4.1 10/21/2022   CO2 23 10/21/2022   BUN 23 10/21/2022   CREATININE 0.66 10/21/2022   GFRNONAA >60 10/21/2022   CALCIUM 9.3 10/21/2022   ALBUMIN 4.3 10/21/2022   GLUCOSE 186 (H) 10/21/2022   Lab Results  Component Value Date   ALT 19 10/21/2022   AST 19 10/21/2022   ALKPHOS 69 10/21/2022   BILITOT 0.9 10/21/2022   Lab Results  Component Value Date   WBC 15.0 (H) 10/21/2022   HGB 13.6 10/21/2022   HCT 39.8 10/21/2022   MCV 90.7 10/21/2022   PLT 271 10/21/2022   Lab Results  Component Value Date   LIPASE 27 10/21/2022    Imaging Studies   CT ANGIO GI BLEED  Result Date: 10/21/2022 CLINICAL DATA:  Lower gastrointestinal hemorrhage, abdominal pain, diarrhea EXAM: CTA ABDOMEN AND PELVIS WITHOUT AND WITH CONTRAST TECHNIQUE: Multidetector CT  imaging of the abdomen and pelvis was performed using the standard protocol during bolus administration of intravenous contrast. Multiplanar reconstructed images and MIPs were obtained and reviewed to evaluate the vascular anatomy. RADIATION DOSE REDUCTION: This exam was performed according to the departmental dose-optimization program which includes automated exposure control, adjustment of the mA and/or kV according to patient size and/or use of iterative reconstruction technique. CONTRAST:  OMNIPAQUE IOHEXOL 350 MG/ML SOLN COMPARISON:  None Available. FINDINGS: VASCULAR Aorta: Normal caliber aorta without aneurysm, dissection, vasculitis or significant stenosis. Mild atherosclerotic calcification. Celiac: Patent without evidence of aneurysm, dissection, vasculitis or significant stenosis. SMA: Patent without evidence of aneurysm, dissection, vasculitis or significant stenosis. Renals: Both renal arteries are patent without evidence of aneurysm, dissection, vasculitis, fibromuscular dysplasia or significant stenosis. Tiny accessory left lower pole renal artery noted. IMA: Patent without evidence of aneurysm, dissection, vasculitis or significant stenosis. Inflow: Patent without evidence of aneurysm, dissection, vasculitis or significant stenosis. Proximal Outflow: Bilateral common femoral and visualized portions of the superficial and profunda femoral arteries are patent without evidence of aneurysm, dissection, vasculitis or significant stenosis. Veins: No obvious venous abnormality within the limitations of this arterial phase study. Review of the MIP images confirms the above findings. NON-VASCULAR Lower chest: No acute abnormality. Hepatobiliary: No focal liver abnormality is seen. Status post cholecystectomy. No biliary dilatation. Pancreas: Unremarkable Spleen: Unremarkable Adrenals/Urinary Tract: Adrenal glands are unremarkable. Kidneys are normal, without renal calculi, focal lesion, or  hydronephrosis. Bladder is unremarkable. Stomach/Bowel: Large left lateral abdominal wall hernia is present containing and unremarkable portion of the descending and sigmoid colon. The stomach, small bowel, and large bowel are otherwise unremarkable. No evidence of obstruction or focal inflammation. Appendix normal. No free intraperitoneal gas or fluid. No active gastrointestinal hemorrhage identified. Lymphatic: No pathologic adenopathy within the abdomen and pelvis. Reproductive: Uterus and bilateral adnexa are unremarkable. Other: Ventral hernia repair with mesh has been performed. Musculoskeletal: No acute bone abnormality. No lytic or blastic bone lesion. Degenerative changes are seen within the lumbar spine. IMPRESSION: 1. No active gastrointestinal hemorrhage identified. No acute intra-abdominal pathology identified. 2. Large left lateral abdominal wall hernia containing unremarkable portion of the descending and sigmoid colon. No evidence of obstruction. Aortic Atherosclerosis (ICD10-I70.0). Electronically Signed   By: Helyn Numbers M.D.   On: 10/21/2022 23:22    Assessment   *Rectal bleeding *H/O adenomatous colon polyps *Constipation  Significant constipation, using daily enemas. Could not afford Linzess but really has not tried other options. Educated regarding use of Fleet's enema too frequently due to risk of kidney issues. Same as with magnesium citrate. Would look for other options for daily bowel regimen. Recent ED visit in setting of N/V/D and left sided pain possible due to Jardiance. CTA without active bleeding or colitis making ischemic colitis less likely. Given her history of recent bleeding and advanced colon polyps, she desires updating colonoscopy at this time.      PLAN   Amitiza once to twice daily with food.  If Amitiza not affordable, then miralax one capful 2-3 times daily until soft stools, then continue once daily.  Patient will need to let us know if your  constipation is not adequately treated. Goal of stopping enemas.  She does not require PPI at this time as she has limited reflux symptoms that respond to OTC antacids.  Hold off  on Bentyl provided by ED as this will likely worsen her constipation.  Limit high FODMAP foods to reduce gas.  Colonoscopy to be scheduled. ASA 3.  I have discussed the risks, alternatives, benefits with regards to but not limited to the risk of reaction to medication, bleeding, infection, perforation and the patient is agreeable to proceed. Written consent to be obtained. Patient is aware to notify us of any new medication changes between now and her procedure, she is aware that some need to be help 7 days in advance of her colonoscopy so to let us know asap. She anticipates new diabetes medication now that she cannot take Jardiance.    Leanna Battles. Melvyn Neth, MHS, PA-C [facility]

## 2022-11-12 NOTE — H&P (View-Only) (Signed)
 GI Office Note    Referring Provider: Glendora Score, MD Primary Care Physician:  Toma Deiters, MD  Primary Gastroenterologist: Hennie Duos. Marletta Lor, DO   Chief Complaint   Chief Complaint  Patient presents with   New Patient (Initial Visit)    Pt stated she was referred for rectal bleeding but it only happened once. She has had no further bleeding.      History of Present Illness   Andrea Rios is a 72 y.o. female presenting today at the request of the EDP. Recently seen in the ED for left lower abdominal pain, diarrhea, vomiting, rectal bleeding which started about a week after starting Jardiance.  In the ED her Hgb was 13.6, WBC 15,000. CTA GI bleeding scan showed no active GI bleeding. She has a large left lateral abdominal wall hernia containing unremarkable portion of descending and sigmoid colon. No evidence of obstruction.   Patient reports chronic constipation. In the past she did well with Linzess but could not afford the RX, $80 per month. She has never tried Miralax. Typically she will use a Fleet's enema and then reuse the bottle to give tap water enemas at least 3-4 times before throwing out and starting with new Fleet's enema bottle. She may be getting a Fleet's 2-3 times per week and the rest of the time uses tap water. She typically only passes balls of stool and does not feel like she is getting good movements. Sometimes she also takes magnesium citrate. Eats about 2 servings of fruit/veggies per day.  She has had no further n/v/d since in the ED. No abdominal pain. Has infrequent heartburn for which she takes OTC antacids. She is not sure she needs pantoprazole or bentyl given by EDP. She is no longer taken Jardiance. Has to see PCP for other options. She denies further rectal bleeding. The day in the ED, she passed mostly fresh blood but some darker maroon color only once.      H/o precancerous colon plyps on the colonoscpy by Dr. Linna Darner per patient. But she  cannot recall findings of her last colonoscopy with Dr. Teena Dunk.   She has large left abdominal hernia.   Reviewed path records on EPIC: she had tubulovillous adenoma of colon in 2003. And tubular adenoma in 2015.  Medications   Current Outpatient Medications  Medication Sig Dispense Refill   aspirin 81 MG chewable tablet Chew 81 mg by mouth daily.     budesonide-formoterol (SYMBICORT) 160-4.5 MCG/ACT inhaler Inhale 2 puffs into the lungs 2 (two) times daily as needed.      cyclobenzaprine (FLEXERIL) 5 MG tablet Take 5 mg by mouth once. In the morning     Fish Oil-Cholecalciferol (FISH OIL + D3 PO) Take 3 tablets by mouth daily.     gabapentin (NEURONTIN) 100 MG capsule Take 100 mg by mouth at bedtime.     glimepiride (AMARYL) 2 MG tablet Take 2 mg by mouth daily before breakfast.     hydrochlorothiazide (HYDRODIURIL) 50 MG tablet Take 50 mg by mouth daily.     ketorolac (ACULAR) 0.5 % ophthalmic solution Place 1 drop into the right eye 4 (four) times daily.     lisinopril (PRINIVIL,ZESTRIL) 40 MG tablet Take 40 mg by mouth daily.     metFORMIN (GLUCOPHAGE) 500 MG tablet Take 500 mg by mouth 3 (three) times daily before meals.     metoprolol succinate (TOPROL-XL) 50 MG 24 hr tablet Take 1 tablet by mouth daily.  montelukast (SINGULAIR) 10 MG tablet Take 1 tablet by mouth daily.     nitroGLYCERIN (NITROSTAT) 0.4 MG SL tablet Place 0.4 mg under the tongue every 5 (five) minutes as needed for chest pain.     pantoprazole (PROTONIX) 20 MG tablet Take 1 tablet (20 mg total) by mouth daily. 30 tablet 0   Respiratory Therapy Supplies (CARETOUCH 2 CPAP HOSE HANGER) MISC by Does not apply route.     rosuvastatin (CRESTOR) 5 MG tablet Take by mouth.     albuterol (PROVENTIL) (2.5 MG/3ML) 0.083% nebulizer solution Inhale into the lungs. (Patient not taking: Reported on 11/12/2022)     montelukast (SINGULAIR) 10 MG tablet Take 10 mg by mouth daily. (Patient not taking: Reported on 11/12/2022)     No  current facility-administered medications for this visit.    Allergies   Allergies as of 11/12/2022 - Review Complete 11/12/2022  Allergen Reaction Noted   Prednisone Hives and Swelling 10/09/2012   Sulfa antibiotics Hives 10/09/2012   Zocor [simvastatin]  03/11/2016    Past Medical History   Past Medical History:  Diagnosis Date   Asthma    Back pain    Cerebrovascular disease    Constipation    Diabetes (HCC)    Fibromyalgia    H/O colonoscopy 04/12/2014   polp, diverticulosis, Dr. Teena Dunk   H/O mammogram 11/2014   normal   HTN (hypertension)    Hyperlipidemia    IBS (irritable bowel syndrome)    Lumbago    Obesity    Paronychia of toe    Snoring    Stroke [facility]) 2003    Past Surgical History   Past Surgical History:  Procedure Laterality Date   GALLBLADDER SURGERY     HERNIA REPAIR     x3   LEFT HEART CATHETERIZATION WITH CORONARY ANGIOGRAM N/A 10/09/2012   Procedure: LEFT HEART CATHETERIZATION WITH CORONARY ANGIOGRAM;  Surgeon: Peter M Swaziland, MD;  Location: [facility] CATH LAB;  Service: Cardiovascular;  Laterality: N/A;   TONSILLECTOMY AND ADENOIDECTOMY      Past Family History   Family History  Problem Relation Age of Onset   Heart failure Mother    Heart failure Father    Parkinson's disease Sister    Lung cancer Sister     Past Social History   Social History   Socioeconomic History   Marital status: Widowed    Spouse name: Not on file   Number of children: 2   Years of education: Not on file   Highest education level: High school graduate  Occupational History   Not on file  Tobacco Use   Smoking status: Former    Current packs/day: 0.00    Average packs/day: 2.0 packs/day for 12.0 years (23.9 ttl pk-yrs)    Types: Cigarettes    Start date: 06/19/1965    Quit date: 04/26/1977    Years since quitting: 45.5    Passive exposure: Never   Smokeless tobacco: Never  Vaping Use   Vaping status: Never Used  Substance and Sexual Activity   Alcohol  use: Not Currently    Comment: occasional   Drug use: Never   Sexual activity: Not Currently  Other Topics Concern   Not on file  Social History Narrative   [place] ranch, R handed, lives with son and granddaughter, caffeine - 1 cup coffee am; used to work as Lawyer ; few months of college      [employer] witness - NO BLOOD PRODUCTS   Social Determinants  of Health   Financial Resource Strain: Not on file  Food Insecurity: Not on file  Transportation Needs: Not on file  Physical Activity: Inactive (12/06/2019)   [facility], [facility]   Exercise Vital Sign    Days of Exercise per Week: 0 days    Minutes of Exercise per Session: 0 min  Stress: Not on file  Social Connections: Unknown (07/26/2022)   Received from [facility], [facility]   Social Network    Social Network: Not on file  Intimate Partner Violence: Unknown (07/26/2022)   Received from [facility], [facility]   HITS    Physically Hurt: Not on file    Insult or Talk Down To: Not on file    Threaten Physical Harm: Not on file    Scream or Curse: Not on file    Review of Systems   General: Negative for anorexia, weight loss, fever, chills, fatigue, weakness. Ambulates with walker Eyes: Negative for vision changes.  ENT: Negative for hoarseness, difficulty swallowing , nasal congestion. CV: Negative for chest pain, angina, palpitations, +dyspnea on exertion, +peripheral edema.  Respiratory: Negative for dyspnea at rest, +dyspnea on exertion. No cough, sputum, wheezing.  GI: See history of present illness. GU:  Negative for dysuria, hematuria, urinary incontinence, urinary frequency, nocturnal urination.  MS: Negative for joint pain, low back pain.  Derm: Negative for rash or itching.  Neuro: Negative for weakness, abnormal sensation, seizure, frequent headaches, memory loss,  confusion.  Psych: Negative for anxiety, depression, suicidal ideation, hallucinations.  Endo: Negative for  unusual weight change.  Heme: Negative for bruising or bleeding. Allergy: Negative for rash or hives.  Physical Exam   BP 129/70   Pulse 73   Temp 98.9 F (37.2 C)   Ht 5\' 3"  (1.6 m)   Wt 240 lb (108.9 kg)   BMI 42.51 kg/m    General: Well-nourished, well-developed in no acute distress.  Head: Normocephalic, atraumatic.   Eyes: Conjunctiva pink, no icterus. Mouth: Oropharyngeal mucosa moist and pink , no lesions erythema or exudate. Neck: Supple without thyromegaly, masses, or lymphadenopathy.  Lungs: Clear to auscultation bilaterally.  Heart: Regular rate and rhythm, no murmurs rubs or gallops.  Abdomen: Bowel sounds are normal, nontender, nondistended, no hepatosplenomegaly or masses,  no abdominal bruits, no rebound or guarding.  Large lateral wall hernia. Diastasis recti. Rectal: not performed Extremities: No lower extremity edema. No clubbing or deformities.  Neuro: Alert and oriented x 4 , grossly normal neurologically.  Skin: Warm and dry, no rash or jaundice.   Psych: Alert and cooperative, normal mood and affect.  Labs   Lab Results  Component Value Date   NA 129 (L) 10/21/2022   CL 93 (L) 10/21/2022   K 4.1 10/21/2022   CO2 23 10/21/2022   BUN 23 10/21/2022   CREATININE 0.66 10/21/2022   GFRNONAA >60 10/21/2022   CALCIUM 9.3 10/21/2022   ALBUMIN 4.3 10/21/2022   GLUCOSE 186 (H) 10/21/2022   Lab Results  Component Value Date   ALT 19 10/21/2022   AST 19 10/21/2022   ALKPHOS 69 10/21/2022   BILITOT 0.9 10/21/2022   Lab Results  Component Value Date   WBC 15.0 (H) 10/21/2022   HGB 13.6 10/21/2022   HCT 39.8 10/21/2022   MCV 90.7 10/21/2022   PLT 271 10/21/2022   Lab Results  Component Value Date   LIPASE 27 10/21/2022    Imaging Studies   CT ANGIO GI BLEED  Result Date: 10/21/2022 CLINICAL DATA:  Lower gastrointestinal hemorrhage, abdominal pain, diarrhea EXAM: CTA ABDOMEN AND PELVIS WITHOUT AND WITH CONTRAST TECHNIQUE: Multidetector CT  imaging of the abdomen and pelvis was performed using the standard protocol during bolus administration of intravenous contrast. Multiplanar reconstructed images and MIPs were obtained and reviewed to evaluate the vascular anatomy. RADIATION DOSE REDUCTION: This exam was performed according to the departmental dose-optimization program which includes automated exposure control, adjustment of the mA and/or kV according to patient size and/or use of iterative reconstruction technique. CONTRAST:  OMNIPAQUE IOHEXOL 350 MG/ML SOLN COMPARISON:  None Available. FINDINGS: VASCULAR Aorta: Normal caliber aorta without aneurysm, dissection, vasculitis or significant stenosis. Mild atherosclerotic calcification. Celiac: Patent without evidence of aneurysm, dissection, vasculitis or significant stenosis. SMA: Patent without evidence of aneurysm, dissection, vasculitis or significant stenosis. Renals: Both renal arteries are patent without evidence of aneurysm, dissection, vasculitis, fibromuscular dysplasia or significant stenosis. Tiny accessory left lower pole renal artery noted. IMA: Patent without evidence of aneurysm, dissection, vasculitis or significant stenosis. Inflow: Patent without evidence of aneurysm, dissection, vasculitis or significant stenosis. Proximal Outflow: Bilateral common femoral and visualized portions of the superficial and profunda femoral arteries are patent without evidence of aneurysm, dissection, vasculitis or significant stenosis. Veins: No obvious venous abnormality within the limitations of this arterial phase study. Review of the MIP images confirms the above findings. NON-VASCULAR Lower chest: No acute abnormality. Hepatobiliary: No focal liver abnormality is seen. Status post cholecystectomy. No biliary dilatation. Pancreas: Unremarkable Spleen: Unremarkable Adrenals/Urinary Tract: Adrenal glands are unremarkable. Kidneys are normal, without renal calculi, focal lesion, or  hydronephrosis. Bladder is unremarkable. Stomach/Bowel: Large left lateral abdominal wall hernia is present containing and unremarkable portion of the descending and sigmoid colon. The stomach, small bowel, and large bowel are otherwise unremarkable. No evidence of obstruction or focal inflammation. Appendix normal. No free intraperitoneal gas or fluid. No active gastrointestinal hemorrhage identified. Lymphatic: No pathologic adenopathy within the abdomen and pelvis. Reproductive: Uterus and bilateral adnexa are unremarkable. Other: Ventral hernia repair with mesh has been performed. Musculoskeletal: No acute bone abnormality. No lytic or blastic bone lesion. Degenerative changes are seen within the lumbar spine. IMPRESSION: 1. No active gastrointestinal hemorrhage identified. No acute intra-abdominal pathology identified. 2. Large left lateral abdominal wall hernia containing unremarkable portion of the descending and sigmoid colon. No evidence of obstruction. Aortic Atherosclerosis (ICD10-I70.0). Electronically Signed   By: Helyn Numbers M.D.   On: 10/21/2022 23:22    Assessment   *Rectal bleeding *H/O adenomatous colon polyps *Constipation  Significant constipation, using daily enemas. Could not afford Linzess but really has not tried other options. Educated regarding use of Fleet's enema too frequently due to risk of kidney issues. Same as with magnesium citrate. Would look for other options for daily bowel regimen. Recent ED visit in setting of N/V/D and left sided pain possible due to Jardiance. CTA without active bleeding or colitis making ischemic colitis less likely. Given her history of recent bleeding and advanced colon polyps, she desires updating colonoscopy at this time.      PLAN   Amitiza once to twice daily with food.  If Amitiza not affordable, then miralax one capful 2-3 times daily until soft stools, then continue once daily.  Patient will need to let us know if your  constipation is not adequately treated. Goal of stopping enemas.  She does not require PPI at this time as she has limited reflux symptoms that respond to OTC antacids.  Hold off  on Bentyl provided by ED as this will likely worsen her constipation.  Limit high FODMAP foods to reduce gas.  Colonoscopy to be scheduled. ASA 3.  I have discussed the risks, alternatives, benefits with regards to but not limited to the risk of reaction to medication, bleeding, infection, perforation and the patient is agreeable to proceed. Written consent to be obtained. Patient is aware to notify us of any new medication changes between now and her procedure, she is aware that some need to be help 7 days in advance of her colonoscopy so to let us know asap. She anticipates new diabetes medication now that she cannot take Jardiance.    Leanna Battles. Melvyn Neth, MHS, PA-C [facility]

## 2022-11-12 NOTE — Patient Instructions (Addendum)
It was a pleasure to meet you today!  I have sent in medication for constipation called lubiprostone (Amitiza) to take once to twice daily for constipation. I have started at the highest dose, we can adjust if needed. Please call if not affordable.  If lubiprostone is not affordable, you can start miralax which is over the counter. Take one capful 2-3 times daily until you have several good soft bowel movements, then take once a day thereafter.   IF NEW MEDICATIONS DON'T IMPROVE YOUR BOWEL MOVEMENTS PLEASE LET ME KNOW. GOAL OF NOT NEEDING ENEMAS.  If you have good control of your reflux with over the counter antacids, you do NOT have to take pantoprazole.   Please don't take dicyclomine given by the ER as this will make your constipation worse.   Limit foods on the list below to 2-3 times per week as they can increase intestinal gas.   We will get a copy of your last colonoscopy for review.  We will be in touch to schedule you for a colonoscopy. IF YOU START ANY NEW DIABETES MEDICATIONS PLEASE LET us KNOW AS SOME HAVE TO BE HELD SOMETIMES UP TO A WEEK BEFORE YOUR PROCEDURE.   Foods to limit Fruits Fresh, dried, and juiced forms of apple, pear, watermelon, peach, plum, cherries, apricots, blackberries, boysenberries, figs, nectarines, and mango. Avocado. Vegetables Chicory root, artichoke, asparagus, cabbage, snow peas, Brussels sprouts, broccoli, sugar snap peas, mushrooms, celery, and cauliflower. Onions, garlic, leeks, and the white part of scallions. Grains Wheat, including kamut, durum, and semolina. Barley and bulgur. Couscous. Wheat-based cereals. Wheat noodles, bread, crackers, and pastries. Meats and other proteins Fried or fatty meat. Sausage. Cashews and pistachios. Soybeans, baked beans, black beans, chickpeas, kidney beans, fava beans, navy beans, lentils, black-eyed peas, and split peas. Dairy Milk, yogurt, ice cream, and soft cheese. Cream and sour cream. Milk-based sauces.  Custard. Buttermilk. Soy milk. Seasoning and other foods Any sugar-free gum or candy. Foods that contain artificial sweeteners such as sorbitol, mannitol, isomalt, or xylitol. Foods that contain honey, high-fructose corn syrup, or agave. Bouillon, vegetable stock, beef stock, and chicken stock. Garlic and onion powder. Condiments made with onion, such as hummus, chutney, pickles, relish, salad dressing, and salsa. Tomato paste. Beverages Chicory-based drinks. Coffee substitutes. Chamomile tea. Fennel tea. Sweet or fortified wines such as port or sherry. Diet soft drinks made with isomalt, mannitol, maltitol, sorbitol, or xylitol. Apple, pear, and mango juice. Juices with high-fructose corn syrup. The items listed above may not be a complete list of foods and beverages you should avoid. Contact a dietitian for more information.   Recommended foods Fruits Bananas, oranges, tangerines, lemons, limes, blueberries, raspberries, strawberries, grapes, cantaloupe, honeydew melon, kiwi, papaya, passion fruit, and pineapple. Limited amounts of dried cranberries, banana chips, and shredded coconut. Vegetables Eggplant, zucchini, cucumber, peppers, green beans, bean sprouts, lettuce, arugula, kale, Swiss chard, spinach, collard greens, bok choy, summer squash, potato, and tomato. Limited amounts of corn, carrot, and sweet potato. Green parts of scallions. Grains Gluten-free grains, such as rice, oats, buckwheat, quinoa, corn, polenta, and millet. Gluten-free pasta, bread, or cereal. Rice noodles. Corn tortillas. Meats and other proteins Unseasoned beef, pork, poultry, or fish. Eggs. Tomasa Blase. Tofu (firm) and tempeh. Limited amounts of nuts and seeds, such as almonds, walnuts, [place] nuts, pecans, peanuts, nut butters, pumpkin seeds, chia seeds, and sunflower seeds. Dairy Lactose-free milk, yogurt, and kefir. Lactose-free cottage cheese and ice cream. Non-dairy milks, such as almond, coconut, hemp, and rice milk.  Non-dairy  yogurt. Limited amounts of goat cheese, brie, mozzarella, parmesan, swiss, and other hard cheeses. Fats and oils Butter-free spreads. Vegetable oils, such as olive, canola, and sunflower oil. Seasoning and other foods Artificial sweeteners with names that do not end in "ol," such as aspartame, saccharine, and stevia. Maple syrup, white table sugar, raw sugar, brown sugar, and molasses. Mayonnaise, soy sauce, and tamari. Fresh basil, coriander, parsley, rosemary, and thyme. Beverages Water and mineral water. Sugar-sweetened soft drinks. Small amounts of orange juice or cranberry juice. Black and green tea. Most dry wines. Coffee.   It was a pleasure to see you today. I want to create trusting relationships with patients and provide genuine, compassionate, and quality care. I truly value your feedback, so please be on the lookout for a survey regarding your visit with me today. I appreciate your time in completing this!

## 2022-11-15 ENCOUNTER — Telehealth: Payer: Self-pay | Admitting: *Deleted

## 2022-11-15 ENCOUNTER — Encounter: Payer: Self-pay | Admitting: *Deleted

## 2022-11-15 MED ORDER — PEG 3350-KCL-NA BICARB-NACL 420 G PO SOLR: 4000.0000 mL | Freq: Once | ORAL | 0 refills | Status: AC

## 2022-11-15 NOTE — Telephone Encounter (Signed)
error 

## 2022-11-19 DIAGNOSIS — S83281A Other tear of lateral meniscus, current injury, right knee, initial encounter: Secondary | ICD-10-CM | POA: Diagnosis not present

## 2022-11-19 DIAGNOSIS — M948X6 Other specified disorders of cartilage, lower leg: Secondary | ICD-10-CM | POA: Diagnosis not present

## 2022-11-19 DIAGNOSIS — M25461 Effusion, right knee: Secondary | ICD-10-CM | POA: Diagnosis not present

## 2022-11-19 DIAGNOSIS — M4726 Other spondylosis with radiculopathy, lumbar region: Secondary | ICD-10-CM | POA: Diagnosis not present

## 2022-11-19 DIAGNOSIS — S83241A Other tear of medial meniscus, current injury, right knee, initial encounter: Secondary | ICD-10-CM | POA: Diagnosis not present

## 2022-11-19 DIAGNOSIS — S83411A Sprain of medial collateral ligament of right knee, initial encounter: Secondary | ICD-10-CM | POA: Diagnosis not present

## 2022-11-19 DIAGNOSIS — M7121 Synovial cyst of popliteal space [Baker], right knee: Secondary | ICD-10-CM | POA: Diagnosis not present

## 2022-11-19 DIAGNOSIS — M5116 Intervertebral disc disorders with radiculopathy, lumbar region: Secondary | ICD-10-CM | POA: Diagnosis not present

## 2022-11-19 DIAGNOSIS — M5115 Intervertebral disc disorders with radiculopathy, thoracolumbar region: Secondary | ICD-10-CM | POA: Diagnosis not present

## 2022-11-19 DIAGNOSIS — M4727 Other spondylosis with radiculopathy, lumbosacral region: Secondary | ICD-10-CM | POA: Diagnosis not present

## 2022-11-19 DIAGNOSIS — M2341 Loose body in knee, right knee: Secondary | ICD-10-CM | POA: Diagnosis not present

## 2022-11-19 DIAGNOSIS — M1711 Unilateral primary osteoarthritis, right knee: Secondary | ICD-10-CM | POA: Diagnosis not present

## 2022-11-19 DIAGNOSIS — M5117 Intervertebral disc disorders with radiculopathy, lumbosacral region: Secondary | ICD-10-CM | POA: Diagnosis not present

## 2022-12-01 DIAGNOSIS — E1169 Type 2 diabetes mellitus with other specified complication: Secondary | ICD-10-CM | POA: Diagnosis not present

## 2022-12-01 DIAGNOSIS — I25118 Atherosclerotic heart disease of native coronary artery with other forms of angina pectoris: Secondary | ICD-10-CM | POA: Diagnosis not present

## 2022-12-01 DIAGNOSIS — E785 Hyperlipidemia, unspecified: Secondary | ICD-10-CM | POA: Diagnosis not present

## 2022-12-01 DIAGNOSIS — I10 Essential (primary) hypertension: Secondary | ICD-10-CM | POA: Diagnosis not present

## 2022-12-01 NOTE — Patient Instructions (Signed)
Andrea Rios  12/01/2022     @PREFPERIOPPHARMACY @   Your procedure is scheduled on  12/10/2022.   Report to [facility] at  [phone]  A.M.   Call this number if you have problems the morning of surgery:  [phone]  If you experience any cold or flu symptoms such as cough, fever, chills, shortness of breath, etc. between now and your scheduled surgery, please notify us at the above number.   Remember:  Follow the diet and prep instructions given to you by the office.      Use your nebulizer and your inhaler before you come and bring your rescue inhaler with you.      Take these medicines the morning of surgery with A SIP OF WATER       flexeril(if needed), metoprolol, singulair, pantoprazole.     Do not wear jewelry, make-up or nail polish, including gel polish,  artificial nails, or any other type of covering on natural nails (fingers and  toes).  Do not wear lotions, powders, or perfumes, or deodorant.  Do not shave 48 hours prior to surgery.  Men may shave face and neck.  Do not bring valuables to the hospital.  [facility] is not responsible for any belongings or valuables.  Contacts, dentures or bridgework may not be worn into surgery.  Leave your suitcase in the car.  After surgery it may be brought to your room.  For patients admitted to the hospital, discharge time will be determined by your treatment team.  Patients discharged the day of surgery will not be allowed to drive home and must have someone with you for 24 hours.    Special instructions:   DO NOT smoke tobacco or vape for 24 hours before your procedure.  Please read over the following fact sheets that you were given. Anesthesia Post-op Instructions and Care and Recovery After Surgery      Colonoscopy, Adult, Care After The following information offers guidance on how to care for yourself after your procedure. Your health care provider may also give you more specific instructions. If you have  problems or questions, contact your health care provider. What can I expect after the procedure? After the procedure, it is common to have: A small amount of blood in your stool for 24 hours after the procedure. Some gas. Mild cramping or bloating of your abdomen. Follow these instructions at home: Eating and drinking  Drink enough fluid to keep your urine pale yellow. Follow instructions from your health care provider about eating or drinking restrictions. Resume your normal diet as told by your health care provider. Avoid heavy or fried foods that are hard to digest. Activity Rest as told by your health care provider. Avoid sitting for a long time without moving. Get up to take short walks every 1-2 hours. This is important to improve blood flow and breathing. Ask for help if you feel weak or unsteady. Return to your normal activities as told by your health care provider. Ask your health care provider what activities are safe for you. Managing cramping and bloating  Try walking around when you have cramps or feel bloated. If directed, apply heat to your abdomen as told by your health care provider. Use the heat source that your health care provider recommends, such as a moist heat pack or a heating pad. Place a towel between your skin and the heat source. Leave the heat on for 20-30 minutes. Remove the  heat if your skin turns bright red. This is especially important if you are unable to feel pain, heat, or cold. You have a greater risk of getting burned. General instructions If you were given a sedative during the procedure, it can affect you for several hours. Do not drive or operate machinery until your health care provider says that it is safe. For the first 24 hours after the procedure: Do not sign important documents. Do not drink alcohol. Do your regular daily activities at a slower pace than normal. Eat soft foods that are easy to digest. Take over-the-counter and prescription  medicines only as told by your health care provider. Keep all follow-up visits. This is important. Contact a health care provider if: You have blood in your stool 2-3 days after the procedure. Get help right away if: You have more than a small spotting of blood in your stool. You have large blood clots in your stool. You have swelling of your abdomen. You have nausea or vomiting. You have a fever. You have increasing pain in your abdomen that is not relieved with medicine. These symptoms may be an emergency. Get help right away. Call 911. Do not wait to see if the symptoms will go away. Do not drive yourself to the hospital. Summary After the procedure, it is common to have a small amount of blood in your stool. You may also have mild cramping and bloating of your abdomen. If you were given a sedative during the procedure, it can affect you for several hours. Do not drive or operate machinery until your health care provider says that it is safe. Get help right away if you have a lot of blood in your stool, nausea or vomiting, a fever, or increased pain in your abdomen. This information is not intended to replace advice given to you by your health care provider. Make sure you discuss any questions you have with your health care provider. Document Revised: 05/25/2022 Document Reviewed: 12/03/2020 Elsevier Patient Education  2024 [employer] Monitored Anesthesia Care, Care After The following information offers guidance on how to care for yourself after your procedure. Your health care provider may also give you more specific instructions. If you have problems or questions, contact your health care provider. What can I expect after the procedure? After the procedure, it is common to have: Tiredness. Little or no memory about what happened during or after the procedure. Impaired judgment when it comes to making decisions. Nausea or vomiting. Some trouble with balance. Follow these  instructions at home: For the time period you were told by your health care provider:  Rest. Do not participate in activities where you could fall or become injured. Do not drive or use machinery. Do not drink alcohol. Do not take sleeping pills or medicines that cause drowsiness. Do not make important decisions or sign legal documents. Do not take care of children on your own. Medicines Take over-the-counter and prescription medicines only as told by your health care provider. If you were prescribed antibiotics, take them as told by your health care provider. Do not stop using the antibiotic even if you start to feel better. Eating and drinking Follow instructions from your health care provider about what you may eat and drink. Drink enough fluid to keep your urine pale yellow. If you vomit: Drink clear fluids slowly and in small amounts as you are able. Clear fluids include water, ice chips, low-calorie sports drinks, and fruit juice that has water added to  it (diluted fruit juice). Eat light and bland foods in small amounts as you are able. These foods include bananas, applesauce, rice, lean meats, toast, and crackers. General instructions  Have a responsible adult stay with you for the time you are told. It is important to have someone help care for you until you are awake and alert. If you have sleep apnea, surgery and some medicines can increase your risk for breathing problems. Follow instructions from your health care provider about wearing your sleep device: When you are sleeping. This includes during daytime naps. While taking prescription pain medicines, sleeping medicines, or medicines that make you drowsy. Do not use any products that contain nicotine or tobacco. These products include cigarettes, chewing tobacco, and vaping devices, such as e-cigarettes. If you need help quitting, ask your health care provider. Contact a health care provider if: You feel nauseous or vomit  every time you eat or drink. You feel light-headed. You are still sleepy or having trouble with balance after 24 hours. You get a rash. You have a fever. You have redness or swelling around the IV site. Get help right away if: You have trouble breathing. You have new confusion after you get home. These symptoms may be an emergency. Get help right away. Call 911. Do not wait to see if the symptoms will go away. Do not drive yourself to the hospital. This information is not intended to replace advice given to you by your health care provider. Make sure you discuss any questions you have with your health care provider. Document Revised: 09/07/2021 Document Reviewed: 09/07/2021 Elsevier Patient Education  2024 [employer].

## 2022-12-04 DIAGNOSIS — G4733 Obstructive sleep apnea (adult) (pediatric): Secondary | ICD-10-CM | POA: Diagnosis not present

## 2022-12-06 ENCOUNTER — Other Ambulatory Visit: Payer: Self-pay

## 2022-12-06 ENCOUNTER — Encounter (HOSPITAL_COMMUNITY)
Admission: RE | Admit: 2022-12-06 | Discharge: 2022-12-06 | Disposition: A | Discharge status: Home / Self Care | Payer: Medicare Other | Source: Ambulatory Visit | Attending: Internal Medicine | Admitting: Internal Medicine

## 2022-12-06 ENCOUNTER — Encounter (HOSPITAL_COMMUNITY): Payer: Self-pay

## 2022-12-06 VITALS — BP 142/61 | HR 74 | Temp 97.8°F | Resp 18 | Ht 64.0 in | Wt 230.0 lb

## 2022-12-06 DIAGNOSIS — Z01818 Encounter for other preprocedural examination: Secondary | ICD-10-CM | POA: Diagnosis present

## 2022-12-06 DIAGNOSIS — E119 Type 2 diabetes mellitus without complications: Secondary | ICD-10-CM | POA: Insufficient documentation

## 2022-12-06 DIAGNOSIS — Z01812 Encounter for preprocedural laboratory examination: Secondary | ICD-10-CM | POA: Diagnosis not present

## 2022-12-06 LAB — BASIC METABOLIC PANEL
Anion gap: 12 (ref 5–15)
BUN: 16 mg/dL (ref 8–23)
CO2: 23 mmol/L (ref 22–32)
Calcium: 9.2 mg/dL (ref 8.9–10.3)
Chloride: 97 mmol/L — ABNORMAL LOW (ref 98–111)
Creatinine, Ser: 0.63 mg/dL (ref 0.44–1.00)
GFR, Estimated: >60 mL/min (ref >60)
Glucose, Bld: 182 mg/dL — ABNORMAL HIGH (ref 70–99)
Potassium: 3.5 mmol/L (ref 3.5–5.1)
Sodium: 132 mmol/L — ABNORMAL LOW (ref 135–145)

## 2022-12-06 NOTE — Pre-Procedure Instructions (Signed)
Comp panel results routed to Dr Marletta Lor.

## 2022-12-10 ENCOUNTER — Ambulatory Visit (HOSPITAL_BASED_OUTPATIENT_CLINIC_OR_DEPARTMENT_OTHER): Payer: Medicare Other | Admitting: Anesthesiology

## 2022-12-10 ENCOUNTER — Ambulatory Visit (HOSPITAL_COMMUNITY)
Admission: RE | Admit: 2022-12-10 | Discharge: 2022-12-10 | Disposition: A | Discharge status: Home / Self Care | Payer: Medicare Other | Attending: Internal Medicine | Admitting: Internal Medicine

## 2022-12-10 ENCOUNTER — Ambulatory Visit (HOSPITAL_COMMUNITY): Payer: Medicare Other | Admitting: Anesthesiology

## 2022-12-10 ENCOUNTER — Telehealth: Payer: Self-pay | Admitting: Internal Medicine

## 2022-12-10 ENCOUNTER — Ambulatory Visit (HOSPITAL_COMMUNITY): Payer: Medicare Other

## 2022-12-10 ENCOUNTER — Encounter (HOSPITAL_COMMUNITY)
Admission: RE | Disposition: A | Discharge status: Home / Self Care | Payer: Self-pay | Source: Home / Self Care | Attending: Internal Medicine

## 2022-12-10 DIAGNOSIS — D128 Benign neoplasm of rectum: Secondary | ICD-10-CM | POA: Insufficient documentation

## 2022-12-10 DIAGNOSIS — Z1211 Encounter for screening for malignant neoplasm of colon: Secondary | ICD-10-CM

## 2022-12-10 DIAGNOSIS — Z86010 Personal history of colonic polyps: Secondary | ICD-10-CM | POA: Diagnosis not present

## 2022-12-10 DIAGNOSIS — M797 Fibromyalgia: Secondary | ICD-10-CM | POA: Insufficient documentation

## 2022-12-10 DIAGNOSIS — K581 Irritable bowel syndrome with constipation: Secondary | ICD-10-CM | POA: Insufficient documentation

## 2022-12-10 DIAGNOSIS — Z87891 Personal history of nicotine dependence: Secondary | ICD-10-CM

## 2022-12-10 DIAGNOSIS — Z6841 Body Mass Index (BMI) 40.0 and over, adult: Secondary | ICD-10-CM | POA: Diagnosis not present

## 2022-12-10 DIAGNOSIS — I25118 Atherosclerotic heart disease of native coronary artery with other forms of angina pectoris: Secondary | ICD-10-CM | POA: Diagnosis not present

## 2022-12-10 DIAGNOSIS — K648 Other hemorrhoids: Secondary | ICD-10-CM

## 2022-12-10 DIAGNOSIS — D126 Benign neoplasm of colon, unspecified: Secondary | ICD-10-CM | POA: Diagnosis not present

## 2022-12-10 DIAGNOSIS — D127 Benign neoplasm of rectosigmoid junction: Secondary | ICD-10-CM

## 2022-12-10 DIAGNOSIS — D125 Benign neoplasm of sigmoid colon: Secondary | ICD-10-CM | POA: Diagnosis not present

## 2022-12-10 DIAGNOSIS — J45909 Unspecified asthma, uncomplicated: Secondary | ICD-10-CM | POA: Insufficient documentation

## 2022-12-10 DIAGNOSIS — Z9889 Other specified postprocedural states: Secondary | ICD-10-CM | POA: Diagnosis not present

## 2022-12-10 DIAGNOSIS — E669 Obesity, unspecified: Secondary | ICD-10-CM | POA: Insufficient documentation

## 2022-12-10 DIAGNOSIS — Z7951 Long term (current) use of inhaled steroids: Secondary | ICD-10-CM | POA: Insufficient documentation

## 2022-12-10 DIAGNOSIS — K625 Hemorrhage of anus and rectum: Secondary | ICD-10-CM | POA: Diagnosis not present

## 2022-12-10 DIAGNOSIS — I10 Essential (primary) hypertension: Secondary | ICD-10-CM

## 2022-12-10 DIAGNOSIS — E119 Type 2 diabetes mellitus without complications: Secondary | ICD-10-CM | POA: Insufficient documentation

## 2022-12-10 DIAGNOSIS — K635 Polyp of colon: Secondary | ICD-10-CM | POA: Diagnosis not present

## 2022-12-10 DIAGNOSIS — Z7984 Long term (current) use of oral hypoglycemic drugs: Secondary | ICD-10-CM | POA: Insufficient documentation

## 2022-12-10 DIAGNOSIS — E785 Hyperlipidemia, unspecified: Secondary | ICD-10-CM | POA: Insufficient documentation

## 2022-12-10 HISTORY — PX: FLEXIBLE SIGMOIDOSCOPY: SHX5431

## 2022-12-10 HISTORY — PX: POLYPECTOMY: SHX5525

## 2022-12-10 LAB — GLUCOSE, CAPILLARY: Glucose-Capillary: 176 mg/dL — ABNORMAL HIGH (ref 70–99)

## 2022-12-10 SURGERY — POLYPECTOMY
Anesthesia: General

## 2022-12-10 MED ORDER — PROPOFOL 500 MG/50ML IV EMUL
INTRAVENOUS | Status: DC | PRN
  Administered 2022-12-10: 10 mg via INTRAVENOUS
  Administered 2022-12-10: 20 mg via INTRAVENOUS
  Administered 2022-12-10: 10 mg via INTRAVENOUS
  Administered 2022-12-10 (×5): 20 mg via INTRAVENOUS
  Administered 2022-12-10: 60 mg via INTRAVENOUS
  Administered 2022-12-10 (×6): 20 mg via INTRAVENOUS

## 2022-12-10 MED ORDER — LIDOCAINE HCL (CARDIAC) PF 100 MG/5ML IV SOSY
PREFILLED_SYRINGE | INTRAVENOUS | Status: DC | PRN
Start: 2022-12-10 — End: 2022-12-10
  Administered 2022-12-10: 60 mg via INTRAVENOUS

## 2022-12-10 MED ORDER — LACTATED RINGERS IV SOLN: INTRAVENOUS | Status: DC

## 2022-12-10 MED ORDER — LACTATED RINGERS IV SOLN: INTRAVENOUS | Status: DC | PRN

## 2022-12-10 NOTE — Anesthesia Preprocedure Evaluation (Addendum)
Anesthesia Evaluation  Patient identified by MRN, date of birth, ID band Patient awake    Reviewed: Allergy & Precautions, H&P , NPO status , Patient's Chart, lab work & pertinent test results  Airway Mallampati: II  TM Distance: >3 FB Neck ROM: Full    Dental  (+) Dental Advisory Given, Missing, Caps   Pulmonary asthma , sleep apnea and Continuous Positive Airway Pressure Ventilation , former smoker   Pulmonary exam normal breath sounds clear to auscultation       Cardiovascular Exercise Tolerance: Good hypertension, Pt. on medications + angina  + CAD  Normal cardiovascular exam Rhythm:Regular Rate:Normal     Neuro/Psych  Neuromuscular disease CVA, No Residual Symptoms  negative psych ROS   GI/Hepatic negative GI ROS, Neg liver ROS,,,  Endo/Other  diabetes, Well Controlled, Type 2, Oral Hypoglycemic Agents    Renal/GU negative Renal ROS  negative genitourinary   Musculoskeletal  (+) Arthritis , Osteoarthritis,  Fibromyalgia -  Abdominal   Peds negative pediatric ROS (+)  Hematology negative hematology ROS (+)   Anesthesia Other Findings   Reproductive/Obstetrics negative OB ROS                             Anesthesia Physical Anesthesia Plan  ASA: 3  Anesthesia Plan: General   Post-op Pain Management: Minimal or no pain anticipated   Induction: Intravenous  PONV Risk Score and Plan: 1 and Propofol infusion  Airway Management Planned: Nasal Cannula and Natural Airway  Additional Equipment:   Intra-op Plan:   Post-operative Plan:   Informed Consent: I have reviewed the patients History and Physical, chart, labs and discussed the procedure including the risks, benefits and alternatives for the proposed anesthesia with the patient or authorized representative who has indicated his/her understanding and acceptance.     Dental advisory given  Plan Discussed with: CRNA and  Surgeon  Anesthesia Plan Comments:        Anesthesia Quick Evaluation

## 2022-12-10 NOTE — Transfer of Care (Signed)
Immediate Anesthesia Transfer of Care Note  Patient: Andrea Rios  Procedure(s) Performed: POLYPECTOMY FLEXIBLE SIGMOIDOSCOPY  Patient Location: PACU  Anesthesia Type:General  Level of Consciousness: awake, alert , oriented, and patient cooperative  Airway & Oxygen Therapy: Patient Spontanous Breathing and Patient connected to nasal cannula oxygen  Post-op Assessment: Report given to RN, Post -op Vital signs reviewed and stable, and Patient moving all extremities X 4  Post vital signs: Reviewed and stable  Last Vitals:  Vitals Value Taken Time  BP    Temp    Pulse    Resp    SpO2     VSS and to be entered by PACU RN. Pt awake.  Last Pain:  Vitals:   12/10/22 0913  PainSc: 3          Complications: No notable events documented.

## 2022-12-10 NOTE — Telephone Encounter (Signed)
Just finished patient's colonoscopy.  Unfortunately she has a very angulated colon due to diverticulosis and previous abdominal surgeries.  I was unable to complete her colonoscopy despite numerous attempts, change in patient position, abdominal pressure.  Can we set her up for CT colonography to further evaluate?  Patient is agreeable.  Thank you

## 2022-12-10 NOTE — Op Note (Signed)
[facility] Patient Name: Andrea Rios Procedure Date: 12/10/2022 9:00 AM MRN: [id number] Date of Birth: [birth year] Attending MD: Hennie Duos. Marletta Lor , [place], [id number] CSN: [id number] Age: 72 Admit Type: Outpatient Procedure:                Colonoscopy Indications:              Rectal bleeding Providers:                Hennie Duos. Marletta Lor, DO, Angelica Ran, Yolanda Bonine Referring MD:              Medicines:                See the Anesthesia note for documentation of the                            administered medications Complications:            No immediate complications. Estimated Blood Loss:     Estimated blood loss was minimal. Procedure:                Pre-Anesthesia Assessment:                           - The anesthesia plan was to use monitored                            anesthesia care (MAC).                           After obtaining informed consent, the colonoscope                            was passed under direct vision. Throughout the                            procedure, the patient's blood pressure, pulse, and                            oxygen saturations were monitored continuously. The                            [id number]-HQ190L ([id number]) scope was introduced through                            the anus with the intention of advancing to the                            transverse colon. The scope was advanced to the                            descending colon before the procedure was aborted.                            Medications were given. The colonoscopy was  performed without difficulty. The patient tolerated                            the procedure well. The quality of the bowel                            preparation was evaluated using the BBPS [facility]                            Bowel Preparation Scale) with scores of: Right                            Colon = 3, Transverse Colon = 3 and Left Colon = 3                             (entire mucosa seen well with no residual staining,                            small fragments of stool or opaque liquid). The                            total BBPS score equals 9. Scope In: 9:18:17 AM Scope Out: 9:49:33 AM Total Procedure Duration: 0 hours 31 minutes 16 seconds  Findings:      Hemorrhoids were found on perianal exam.      Non-bleeding internal hemorrhoids were found during endoscopy.      Two sessile polyps were found in the rectum and recto-sigmoid colon. The       polyps were 4 to 5 mm in size. These polyps were removed with a cold       snare. Resection and retrieval were complete. Impression:               - Hemorrhoids found on perianal exam.                           - Non-bleeding internal hemorrhoids.                           - Two 4 to 5 mm polyps in the rectum and at the                            recto-sigmoid colon, removed with a cold snare.                            Resected and retrieved. Moderate Sedation:      Per Anesthesia Care Recommendation:           - Patient has a contact number available for                            emergencies. The signs and symptoms of potential                            delayed complications were discussed with the  patient. Return to normal activities tomorrow.                            Written discharge instructions were provided to the                            patient.                           - Resume previous diet.                           - Continue present medications.                           - Await pathology results.                           - INCOMPLETE colonoscopy today. Will discuss                            further with patient. Proceed with CT colonography                            vs referral to [facility] for repeat colonoscopy. Procedure Code(s):        --- Professional ---                           [phone], 52, Colonoscopy, flexible; with  removal of                            tumor(s), polyp(s), or other lesion(s) by snare                            technique Diagnosis Code(s):        --- Professional ---                           D12.8, Benign neoplasm of rectum                           D12.7, Benign neoplasm of rectosigmoid junction                           K64.8, Other hemorrhoids                           K62.5, Hemorrhage of anus and rectum CPT copyright 2022 American Medical Association. All rights reserved. The codes documented in this report are preliminary and upon coder review may  be revised to meet current compliance requirements. Hennie Duos. Marletta Lor, DO Hennie Duos. Marletta Lor, DO 12/10/2022 9:57:10 AM This report has been signed electronically. Number of Addenda: 0

## 2022-12-10 NOTE — Anesthesia Postprocedure Evaluation (Signed)
Anesthesia Post Note  Patient: Andrea Rios  Procedure(s) Performed: POLYPECTOMY FLEXIBLE SIGMOIDOSCOPY  Patient location during evaluation: Phase II Anesthesia Type: General Level of consciousness: awake and alert and oriented Pain management: pain level controlled Vital Signs Assessment: post-procedure vital signs reviewed and stable Respiratory status: spontaneous breathing, nonlabored ventilation and respiratory function stable Cardiovascular status: blood pressure returned to baseline and stable Postop Assessment: no apparent nausea or vomiting Anesthetic complications: no  No notable events documented.   Last Vitals:  Vitals:   12/10/22 1000 12/10/22 1057  BP: (!) 151/62 (!) 134/59  Pulse:  (!) 58  Resp: 20 18  Temp:    SpO2: 100%     Last Pain:  Vitals:   12/10/22 1057  TempSrc:   PainSc: 0-No pain                 Camryn Lampson C Kathi Dohn

## 2022-12-10 NOTE — Discharge Instructions (Addendum)
  Colonoscopy Discharge Instructions  Read the instructions outlined below and refer to this sheet in the next few weeks. These discharge instructions provide you with general information on caring for yourself after you leave the hospital. Your doctor may also give you specific instructions. While your treatment has been planned according to the most current medical practices available, unavoidable complications occasionally occur.   ACTIVITY You may resume your regular activity, but move at a slower pace for the next 24 hours.  Take frequent rest periods for the next 24 hours.  Walking will help get rid of the air and reduce the bloated feeling in your belly (abdomen).  No driving for 24 hours (because of the medicine (anesthesia) used during the test).   Do not sign any important legal documents or operate any machinery for 24 hours (because of the anesthesia used during the test).  NUTRITION Drink plenty of fluids.  You may resume your normal diet as instructed by your doctor.  Begin with a light meal and progress to your normal diet. Heavy or fried foods are harder to digest and may make you feel sick to your stomach (nauseated).  Avoid alcoholic beverages for 24 hours or as instructed.  MEDICATIONS You may resume your normal medications unless your doctor tells you otherwise.  WHAT YOU CAN EXPECT TODAY Some feelings of bloating in the abdomen.  Passage of more gas than usual.  Spotting of blood in your stool or on the toilet paper.  IF YOU HAD POLYPS REMOVED DURING THE COLONOSCOPY: No aspirin products for 7 days or as instructed.  No alcohol for 7 days or as instructed.  Eat a soft diet for the next 24 hours.  FINDING OUT THE RESULTS OF YOUR TEST Not all test results are available during your visit. If your test results are not back during the visit, make an appointment with your caregiver to find out the results. Do not assume everything is normal if you have not heard from your  caregiver or the medical facility. It is important for you to follow up on all of your test results.  SEEK IMMEDIATE MEDICAL ATTENTION IF: You have more than a spotting of blood in your stool.  Your belly is swollen (abdominal distention).  You are nauseated or vomiting.  You have a temperature over 101.  You have abdominal pain or discomfort that is severe or gets worse throughout the day.   Your colonoscopy revealed significant diverticulosis in the left side your colon.  This coupled with your previous abdominal surgeries resulted in a very tight angulated colon in the region.  I was unable to pass pediatric colonoscope through this region despite numerous attempts, change in body position, abdominal pressure.  I did remove 2 polyps from the left side of the colon.  Await pathology results, office will contact you.  As I was unable to evaluate the right side of your colon, would recommend CT colonography to further evaluate versus referral to [facility] for repeat colonoscopy.  I hope you have a great rest of your week!  Hennie Duos. Marletta Lor, D.O. Gastroenterology and Hepatology [facility]

## 2022-12-10 NOTE — Interval H&P Note (Signed)
History and Physical Interval Note:  12/10/2022 9:07 AM  Andrea Rios  has presented today for surgery, with the diagnosis of history of colon polyps,rectal bleeding.  The various methods of treatment have been discussed with the patient and family. After consideration of risks, benefits and other options for treatment, the patient has consented to  Procedure(s) with comments: COLONOSCOPY WITH PROPOFOL (N/A) - 10:15 am, ASA 3 as a surgical intervention.  The patient's history has been reviewed, patient examined, no change in status, stable for surgery.  I have reviewed the patient's chart and labs.  Questions were answered to the patient's satisfaction.     Lanelle Bal

## 2022-12-13 LAB — SURGICAL PATHOLOGY

## 2022-12-13 NOTE — Addendum Note (Signed)
Addended by: Armstead Peaks on: 12/13/2022 08:43 AM   Modules accepted: Orders

## 2022-12-13 NOTE — Telephone Encounter (Signed)
I have placed order for CT. [place] [facility] will call patient to schedule

## 2022-12-14 ENCOUNTER — Encounter (HOSPITAL_COMMUNITY): Payer: Self-pay | Admitting: Internal Medicine

## 2023-01-11 ENCOUNTER — Ambulatory Visit: Payer: Medicare Other

## 2023-01-14 ENCOUNTER — Ambulatory Visit: Payer: Medicare Other

## 2023-02-28 ENCOUNTER — Encounter: Payer: Self-pay | Admitting: Gastroenterology

## 2023-03-22 DIAGNOSIS — E1143 Type 2 diabetes mellitus with diabetic autonomic (poly)neuropathy: Secondary | ICD-10-CM | POA: Diagnosis not present

## 2023-03-22 DIAGNOSIS — G4730 Sleep apnea, unspecified: Secondary | ICD-10-CM | POA: Diagnosis not present

## 2023-03-22 DIAGNOSIS — E785 Hyperlipidemia, unspecified: Secondary | ICD-10-CM | POA: Diagnosis not present

## 2023-03-22 DIAGNOSIS — M5432 Sciatica, left side: Secondary | ICD-10-CM | POA: Diagnosis not present

## 2023-03-22 DIAGNOSIS — I10 Essential (primary) hypertension: Secondary | ICD-10-CM | POA: Diagnosis not present

## 2023-04-06 ENCOUNTER — Other Ambulatory Visit: Payer: Self-pay | Admitting: Internal Medicine

## 2023-04-06 DIAGNOSIS — M545 Low back pain, unspecified: Secondary | ICD-10-CM

## 2023-04-21 ENCOUNTER — Ambulatory Visit
Admission: RE | Admit: 2023-04-21 | Discharge: 2023-04-21 | Disposition: A | Discharge status: Home / Self Care | Payer: Medicare Other | Source: Ambulatory Visit | Attending: Internal Medicine | Admitting: Internal Medicine

## 2023-04-21 DIAGNOSIS — Z1211 Encounter for screening for malignant neoplasm of colon: Secondary | ICD-10-CM

## 2023-04-21 NOTE — Discharge Instructions (Signed)

## 2023-04-22 ENCOUNTER — Ambulatory Visit
Admission: RE | Admit: 2023-04-22 | Discharge: 2023-04-22 | Disposition: A | Discharge status: Home / Self Care | Payer: Medicare Other | Source: Ambulatory Visit | Attending: Internal Medicine | Admitting: Internal Medicine

## 2023-04-22 DIAGNOSIS — M545 Low back pain, unspecified: Secondary | ICD-10-CM

## 2023-04-22 DIAGNOSIS — M5416 Radiculopathy, lumbar region: Secondary | ICD-10-CM | POA: Diagnosis not present

## 2023-04-22 MED ORDER — METHYLPREDNISOLONE ACETATE 40 MG/ML INJ SUSP (RADIOLOG
80.0000 mg | Freq: Once | INTRAMUSCULAR | Status: AC
  Administered 2023-04-22: 80 mg via EPIDURAL

## 2023-04-22 MED ORDER — IOPAMIDOL (ISOVUE-M 200) INJECTION 41%
1.0000 mL | Freq: Once | INTRAMUSCULAR | Status: AC
  Administered 2023-04-22: 1 mL via EPIDURAL

## 2023-04-25 ENCOUNTER — Other Ambulatory Visit: Payer: Medicare Other

## 2023-06-17 ENCOUNTER — Ambulatory Visit: Payer: Medicare Other | Admitting: Gastroenterology

## 2023-06-21 ENCOUNTER — Ambulatory Visit: Payer: Medicare Other | Admitting: Gastroenterology

## 2023-06-21 ENCOUNTER — Encounter: Payer: Self-pay | Admitting: Gastroenterology

## 2023-06-21 VITALS — BP 151/75 | HR 60 | Temp 97.6°F | Ht 64.0 in | Wt 235.6 lb

## 2023-06-21 DIAGNOSIS — Z860101 Personal history of adenomatous and serrated colon polyps: Secondary | ICD-10-CM | POA: Diagnosis not present

## 2023-06-21 DIAGNOSIS — K5909 Other constipation: Secondary | ICD-10-CM

## 2023-06-21 DIAGNOSIS — K5900 Constipation, unspecified: Secondary | ICD-10-CM

## 2023-06-21 DIAGNOSIS — Z860100 Personal history of colon polyps, unspecified: Secondary | ICD-10-CM

## 2023-06-21 NOTE — Progress Notes (Signed)
 GI Office Note    Referring Provider: Toma Deiters, MD Primary Care Physician:  Toma Deiters, MD  Primary Gastroenterologist: Hennie Duos. Marletta Lor, DO   Chief Complaint   Chief Complaint  Patient presents with   Follow-up    History of Present Illness   Andrea Rios is a 73 y.o. female presenting today for follow-up.  Last seen in the office in July 2024.  History of significant constipation, using daily enemas.  Cannot afford Linzess.  Advised against regular use of fleets enema due to risk of kidney issues.  Same with magnesium citrate.  We started her on Amitiza.   Colonoscopy completed August 2024 showing hemorrhoids on perianal exam, nonbleeding internal hemorrhoids, there were two 4 to 5 mm polyps (tubular adenomas) in the rectum and rectosigmoid colon removed.  Incomplete colonoscopy due to sharp angulation.    Virtual colonoscopy by CT December 2022 was suboptimal and nondiagnostic due to large amount of retained barium throughout the colon and areas of nondistention, particularly in the right colon.  Lower left lateral abdominal wall hernia containing transverse colon.  No visible fixed polypoid filling defects or annular constricting lesions, but right colon cannot be cleared due to nondistention.  Dr. Marletta Lor suggesting referral to [facility] for attempt to complete her colonoscopy.   H/o precancerous colon plyps on the colonoscpy by Dr. Linna Darner per patient. But she cannot recall findings of her last colonoscopy with Dr. Teena Dunk.  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  Op notes otherwise unavailable.       Today: Patient does not recall whether or not Amitiza was affordable or worked.  She has chronic constipation, very difficult to have a bowel movement.  Never does anything on her own.  She is not sure why she did not get adequately clean for her CT, she took requested prep.    On daily basis she uses a  tapwater enema.  She passes a small amount of stool each day with this.  Every 5 days or so she usually requires bisacodyl 7 to 8 tablets at once to get adequate stools.  Denies any further blood in the stool.  Really denies any significant pain associated with her abdominal hernia.  She is known about it for years.  No upper GI symptoms.  Reflux well-controlled.    She reports difficulty obtaining transportation and is not sure that she wants to pursue colonoscopy in [place].   Medications   Current Outpatient Medications  Medication Sig Dispense Refill   aspirin 81 MG chewable tablet Chew 81 mg by mouth daily.     budesonide-formoterol (SYMBICORT) 160-4.5 MCG/ACT inhaler Inhale 2 puffs into the lungs 2 (two) times daily as needed.      cyclobenzaprine (FLEXERIL) 5 MG tablet Take 5 mg by mouth once. In the morning     Fish Oil-Cholecalciferol (FISH OIL + D3 PO) Take 3 tablets by mouth daily.     gabapentin (NEURONTIN) 100 MG capsule Take 100 mg by mouth at bedtime.     glimepiride (AMARYL) 2 MG tablet Take 2 mg by mouth daily before breakfast.     hydrochlorothiazide (HYDRODIURIL) 50 MG tablet Take 50 mg by mouth daily.     ketorolac (ACULAR) 0.5 % ophthalmic solution Place 1 drop into the right eye 4 (four) times daily.     lisinopril (PRINIVIL,ZESTRIL) 40 MG tablet Take 40 mg by mouth daily.  metFORMIN (GLUCOPHAGE) 500 MG tablet Take 500 mg by mouth 3 (three) times daily before meals.     metoprolol succinate (TOPROL-XL) 50 MG 24 hr tablet Take 1 tablet by mouth daily.     montelukast (SINGULAIR) 10 MG tablet Take 10 mg by mouth daily.     nitroGLYCERIN (NITROSTAT) 0.4 MG SL tablet Place 0.4 mg under the tongue every 5 (five) minutes as needed for chest pain.     pantoprazole (PROTONIX) 20 MG tablet Take 1 tablet (20 mg total) by mouth daily. 30 tablet 0   Respiratory Therapy Supplies (CARETOUCH 2 CPAP HOSE HANGER) MISC by Does not apply route.     rosuvastatin (CRESTOR) 5 MG  tablet Take by mouth.     No current facility-administered medications for this visit.    Allergies   Allergies as of 06/21/2023 - Review Complete 06/21/2023  Allergen Reaction Noted   Empagliflozin Diarrhea 12/01/2022   Prednisone Hives and Swelling 10/09/2012   Sulfa antibiotics Hives 10/09/2012   Zocor [simvastatin]  03/11/2016   Ketorolac Anxiety 03/05/2022       Review of Systems   General: Negative for anorexia, weight loss, fever, chills, fatigue, weakness. ENT: Negative for hoarseness, difficulty swallowing , nasal congestion. CV: Negative for chest pain, angina, palpitations, dyspnea on exertion, peripheral edema.  Respiratory: Negative for dyspnea at rest, dyspnea on exertion, cough, sputum, wheezing.  GI: See history of present illness. GU:  Negative for dysuria, hematuria,  urinary frequency, nocturnal urination.  Positive urinary incontinence Endo: Negative for unusual weight change.     Physical Exam   BP (!) 151/75 (BP Location: Left Arm, Patient Position: Sitting, Cuff Size: Large)   Pulse 60   Temp 97.6 F (36.4 C) (Oral)   Ht 5\' 4"  (1.626 m)   Wt 235 lb 9.6 oz (106.9 kg)   SpO2 96%   BMI 40.44 kg/m    General: Well-nourished, well-developed in no acute distress.  Ambulates with a walker Eyes: No icterus. Mouth: Oropharyngeal mucosa moist and pink  Abdomen: Bowel sounds are normal, nontender, nondistended, no hepatosplenomegaly or masses, no rebound or guarding.  Large lateral abdominal wall hernia. Rectal: not performed  Extremities: No lower extremity edema. No clubbing or deformities. Neuro: Alert and oriented x 4   Skin: Warm and dry, no jaundice.   Psych: Alert and cooperative, normal mood and affect.  Labs   No recent labs available.  TSH 1.95 in June 2024.  Imaging Studies   No results found.  Assessment/Plan:   Chronic constipation: -Linzess not affordable -She is not sure if she was able to obtain Amitiza or if it  worked -Continue tapwater enemas as needed -Recommend adding bisacodyl 10 mg daily, call if not effective -Discouraged using high-dose bisacodyl, taking 7-8 tablets at a time weekly -Return office visit in 3 months, call sooner if bowel regimen not adequate.  History of adenomatous colon polyps: -Recent colonoscopy incomplete, tubular adenomas removed as outlined.  Suspected sharp angulation possibly due to adhesions, cannot exclude large left lateral abdominal hernia playing a role. -CT colonoscopy was not able to adequately clear her colon from underlying lesions as outlined above -Discussed referral to [facility] to attempt to complete colonoscopy.  Patient is apprehensive due to difficulty obtaining rides.  She has upcoming cataract surgery and would like to postpone for now.  She would like to return in a few months to discuss again. -Return office visit in 3 months.    Leanna Battles. Melvyn Neth, MHS,  PA-C [facility]

## 2023-06-21 NOTE — Patient Instructions (Addendum)
 Consider having a colonoscopy at [facility] in [place] to complete evaluation of your colonoscopy. We can discuss at next office visit again if you decide not to pursue right away. For constipation, try bisacodyl 10mg  daily (this is over the counter). If not adequate to improve bowel function please let me know. Return office visit in 3 months to meet with Dr. Marletta Lor.

## 2023-06-24 ENCOUNTER — Ambulatory Visit: Payer: Medicare Other | Admitting: Gastroenterology

## 2023-06-30 ENCOUNTER — Ambulatory Visit: Payer: Medicare Other | Admitting: Internal Medicine

## 2023-07-04 DIAGNOSIS — Z0000 Encounter for general adult medical examination without abnormal findings: Secondary | ICD-10-CM | POA: Diagnosis not present

## 2023-07-04 DIAGNOSIS — E1143 Type 2 diabetes mellitus with diabetic autonomic (poly)neuropathy: Secondary | ICD-10-CM | POA: Diagnosis not present

## 2023-07-04 DIAGNOSIS — G4739 Other sleep apnea: Secondary | ICD-10-CM | POA: Diagnosis not present

## 2023-07-04 DIAGNOSIS — E7849 Other hyperlipidemia: Secondary | ICD-10-CM | POA: Diagnosis not present

## 2023-07-04 DIAGNOSIS — I10 Essential (primary) hypertension: Secondary | ICD-10-CM | POA: Diagnosis not present

## 2023-07-04 DIAGNOSIS — M5432 Sciatica, left side: Secondary | ICD-10-CM | POA: Diagnosis not present

## 2023-08-04 ENCOUNTER — Encounter: Payer: Self-pay | Admitting: Internal Medicine

## 2023-10-11 DIAGNOSIS — E1143 Type 2 diabetes mellitus with diabetic autonomic (poly)neuropathy: Secondary | ICD-10-CM | POA: Diagnosis not present

## 2023-10-11 DIAGNOSIS — G4739 Other sleep apnea: Secondary | ICD-10-CM | POA: Diagnosis not present

## 2023-10-11 DIAGNOSIS — I10 Essential (primary) hypertension: Secondary | ICD-10-CM | POA: Diagnosis not present

## 2023-10-11 DIAGNOSIS — M5432 Sciatica, left side: Secondary | ICD-10-CM | POA: Diagnosis not present

## 2023-10-11 DIAGNOSIS — Z0000 Encounter for general adult medical examination without abnormal findings: Secondary | ICD-10-CM | POA: Diagnosis not present

## 2023-10-11 DIAGNOSIS — E7849 Other hyperlipidemia: Secondary | ICD-10-CM | POA: Diagnosis not present

## 2023-10-13 ENCOUNTER — Other Ambulatory Visit (HOSPITAL_COMMUNITY): Payer: Self-pay | Admitting: Internal Medicine

## 2023-10-13 DIAGNOSIS — M810 Age-related osteoporosis without current pathological fracture: Secondary | ICD-10-CM

## 2023-10-18 ENCOUNTER — Ambulatory Visit (HOSPITAL_COMMUNITY)
Admission: RE | Admit: 2023-10-18 | Discharge: 2023-10-18 | Disposition: A | Discharge status: Home / Self Care | Source: Ambulatory Visit | Attending: Internal Medicine | Admitting: Internal Medicine

## 2023-10-18 DIAGNOSIS — M810 Age-related osteoporosis without current pathological fracture: Secondary | ICD-10-CM | POA: Insufficient documentation

## 2023-10-18 DIAGNOSIS — Z780 Asymptomatic menopausal state: Secondary | ICD-10-CM | POA: Diagnosis not present

## 2023-10-24 ENCOUNTER — Other Ambulatory Visit (HOSPITAL_COMMUNITY): Payer: Self-pay | Admitting: Internal Medicine

## 2023-10-24 DIAGNOSIS — M810 Age-related osteoporosis without current pathological fracture: Secondary | ICD-10-CM

## 2024-01-10 DIAGNOSIS — I10 Essential (primary) hypertension: Secondary | ICD-10-CM | POA: Diagnosis not present

## 2024-01-10 DIAGNOSIS — I872 Venous insufficiency (chronic) (peripheral): Secondary | ICD-10-CM | POA: Diagnosis not present

## 2024-01-10 DIAGNOSIS — E7849 Other hyperlipidemia: Secondary | ICD-10-CM | POA: Diagnosis not present

## 2024-01-10 DIAGNOSIS — E1143 Type 2 diabetes mellitus with diabetic autonomic (poly)neuropathy: Secondary | ICD-10-CM | POA: Diagnosis not present

## 2024-01-10 DIAGNOSIS — M5432 Sciatica, left side: Secondary | ICD-10-CM | POA: Diagnosis not present

## 2024-01-10 DIAGNOSIS — G4739 Other sleep apnea: Secondary | ICD-10-CM | POA: Diagnosis not present

## 2024-02-01 NOTE — Progress Notes (Signed)
 Andrea Rios                                          MRN: [id number]   02/01/2024   The [facility] Quality Team Specialist reviewed this patient medical record for the purposes of chart review for care gap closure. The following were reviewed: chart review for care gap closure-kidney health evaluation for diabetes:uACR.    [facility]I Quality Team

## 2024-02-02 DIAGNOSIS — I10 Essential (primary) hypertension: Secondary | ICD-10-CM | POA: Diagnosis not present

## 2024-02-02 DIAGNOSIS — E1143 Type 2 diabetes mellitus with diabetic autonomic (poly)neuropathy: Secondary | ICD-10-CM | POA: Diagnosis not present

## 2024-02-10 ENCOUNTER — Other Ambulatory Visit (HOSPITAL_COMMUNITY): Payer: Self-pay | Admitting: Internal Medicine

## 2024-02-10 ENCOUNTER — Other Ambulatory Visit (HOSPITAL_COMMUNITY)
Admission: RE | Admit: 2024-02-10 | Discharge: 2024-02-10 | Disposition: A | Source: Ambulatory Visit | Attending: Internal Medicine | Admitting: Internal Medicine

## 2024-02-10 ENCOUNTER — Ambulatory Visit (HOSPITAL_COMMUNITY)
Admission: RE | Admit: 2024-02-10 | Discharge: 2024-02-10 | Disposition: A | Discharge status: Home / Self Care | Source: Ambulatory Visit | Attending: Internal Medicine | Admitting: Internal Medicine

## 2024-02-10 DIAGNOSIS — M25552 Pain in left hip: Secondary | ICD-10-CM | POA: Diagnosis not present

## 2024-02-10 DIAGNOSIS — M160 Bilateral primary osteoarthritis of hip: Secondary | ICD-10-CM | POA: Diagnosis not present

## 2024-02-10 DIAGNOSIS — R52 Pain, unspecified: Secondary | ICD-10-CM

## 2024-02-10 DIAGNOSIS — G8929 Other chronic pain: Secondary | ICD-10-CM | POA: Diagnosis not present

## 2024-02-12 LAB — MICROALBUMIN / CREATININE URINE RATIO
Creatinine, Urine: 31.8 mg/dL
Microalb Creat Ratio: 21 mg/g{creat} (ref 0–29)
Microalb, Ur: 6.6 ug/mL — ABNORMAL HIGH
